# Patient Record
Sex: Female | Born: 1965
Health system: Southern US, Community
[De-identification: ages and names within clinical notes are randomized; demographics above are authoritative.]

## PROBLEM LIST (undated history)

## (undated) DIAGNOSIS — A6000 Herpesviral infection of urogenital system, unspecified: Secondary | ICD-10-CM

## (undated) DIAGNOSIS — K219 Gastro-esophageal reflux disease without esophagitis: Secondary | ICD-10-CM

## (undated) DIAGNOSIS — N920 Excessive and frequent menstruation with regular cycle: Secondary | ICD-10-CM

## (undated) DIAGNOSIS — N946 Dysmenorrhea, unspecified: Secondary | ICD-10-CM

## (undated) DIAGNOSIS — Z8619 Personal history of other infectious and parasitic diseases: Secondary | ICD-10-CM

## (undated) DIAGNOSIS — D649 Anemia, unspecified: Secondary | ICD-10-CM

## (undated) DIAGNOSIS — R002 Palpitations: Secondary | ICD-10-CM

## (undated) DIAGNOSIS — Z8742 Personal history of other diseases of the female genital tract: Secondary | ICD-10-CM

## (undated) DIAGNOSIS — Z8669 Personal history of other diseases of the nervous system and sense organs: Secondary | ICD-10-CM

## (undated) DIAGNOSIS — I10 Essential (primary) hypertension: Secondary | ICD-10-CM

## (undated) DIAGNOSIS — E559 Vitamin D deficiency, unspecified: Secondary | ICD-10-CM

## (undated) DIAGNOSIS — N200 Calculus of kidney: Secondary | ICD-10-CM

## (undated) HISTORY — DX: Anemia, unspecified: D64.9

## (undated) HISTORY — DX: Essential (primary) hypertension: I10

## (undated) HISTORY — DX: Calculus of kidney: N20.0

## (undated) HISTORY — DX: Personal history of other infectious and parasitic diseases: Z86.19

## (undated) HISTORY — PX: APPENDECTOMY: SHX54

## (undated) HISTORY — PX: TONSILLECTOMY AND ADENOIDECTOMY: SUR1326

## (undated) HISTORY — DX: Personal history of other diseases of the female genital tract: Z87.42

## (undated) HISTORY — PX: KNEE ARTHROPLASTY: SHX992

## (undated) HISTORY — DX: Palpitations: R00.2

## (undated) HISTORY — DX: Gastro-esophageal reflux disease without esophagitis: K21.9

## (undated) HISTORY — DX: Vitamin D deficiency, unspecified: E55.9

## (undated) HISTORY — DX: Personal history of other diseases of the nervous system and sense organs: Z86.69

## (undated) HISTORY — DX: Excessive and frequent menstruation with regular cycle: N92.0

## (undated) HISTORY — DX: Herpesviral infection of urogenital system, unspecified: A60.00

## (undated) HISTORY — DX: Dysmenorrhea, unspecified: N94.6

## (undated) HISTORY — PX: COSMETIC SURGERY: SHX468

---

## 1991-01-09 DIAGNOSIS — Z8619 Personal history of other infectious and parasitic diseases: Secondary | ICD-10-CM

## 1991-01-09 HISTORY — DX: Personal history of other infectious and parasitic diseases: Z86.19

## 1997-09-27 ENCOUNTER — Ambulatory Visit (HOSPITAL_COMMUNITY): Admission: RE | Admit: 1997-09-27 | Discharge: 1997-09-27 | Payer: Self-pay | Admitting: Obstetrics and Gynecology

## 1997-11-02 ENCOUNTER — Ambulatory Visit (HOSPITAL_COMMUNITY): Admission: RE | Admit: 1997-11-02 | Discharge: 1997-11-02 | Payer: Self-pay | Admitting: Obstetrics and Gynecology

## 1998-01-10 ENCOUNTER — Ambulatory Visit (HOSPITAL_COMMUNITY): Admission: RE | Admit: 1998-01-10 | Discharge: 1998-01-10 | Payer: Self-pay | Admitting: Obstetrics and Gynecology

## 1998-01-17 ENCOUNTER — Encounter: Payer: Self-pay | Admitting: Obstetrics and Gynecology

## 1998-01-17 ENCOUNTER — Ambulatory Visit (HOSPITAL_COMMUNITY): Admission: RE | Admit: 1998-01-17 | Discharge: 1998-01-17 | Payer: Self-pay | Admitting: Obstetrics and Gynecology

## 1998-04-05 ENCOUNTER — Ambulatory Visit (HOSPITAL_COMMUNITY): Admission: RE | Admit: 1998-04-05 | Discharge: 1998-04-05 | Payer: Self-pay

## 2007-03-11 ENCOUNTER — Ambulatory Visit (HOSPITAL_COMMUNITY): Admission: RE | Admit: 2007-03-11 | Discharge: 2007-03-11 | Payer: Self-pay | Admitting: Cardiology

## 2007-03-11 ENCOUNTER — Encounter (INDEPENDENT_AMBULATORY_CARE_PROVIDER_SITE_OTHER): Payer: Self-pay | Admitting: Cardiology

## 2007-03-20 ENCOUNTER — Ambulatory Visit (HOSPITAL_COMMUNITY): Admission: RE | Admit: 2007-03-20 | Discharge: 2007-03-20 | Payer: Self-pay | Admitting: Gastroenterology

## 2007-08-13 ENCOUNTER — Other Ambulatory Visit: Admission: RE | Admit: 2007-08-13 | Discharge: 2007-08-13 | Payer: Self-pay | Admitting: Obstetrics and Gynecology

## 2007-08-13 ENCOUNTER — Encounter: Admission: RE | Admit: 2007-08-13 | Discharge: 2007-08-13 | Payer: Self-pay | Admitting: Cardiology

## 2007-12-12 ENCOUNTER — Ambulatory Visit (HOSPITAL_COMMUNITY): Admission: RE | Admit: 2007-12-12 | Discharge: 2007-12-12 | Payer: Self-pay | Admitting: Orthopedic Surgery

## 2008-02-13 ENCOUNTER — Ambulatory Visit (HOSPITAL_COMMUNITY): Admission: RE | Admit: 2008-02-13 | Discharge: 2008-02-13 | Payer: Self-pay | Admitting: Family Medicine

## 2008-02-24 ENCOUNTER — Ambulatory Visit: Payer: Self-pay | Admitting: Nurse Practitioner

## 2008-03-09 ENCOUNTER — Ambulatory Visit: Payer: Self-pay | Admitting: Nurse Practitioner

## 2008-08-26 ENCOUNTER — Ambulatory Visit (HOSPITAL_COMMUNITY): Admission: RE | Admit: 2008-08-26 | Discharge: 2008-08-26 | Payer: Self-pay | Admitting: Obstetrics and Gynecology

## 2008-12-05 ENCOUNTER — Emergency Department (HOSPITAL_COMMUNITY): Admission: EM | Admit: 2008-12-05 | Discharge: 2008-12-05 | Payer: Self-pay | Admitting: Emergency Medicine

## 2009-01-17 ENCOUNTER — Ambulatory Visit (HOSPITAL_COMMUNITY): Admission: RE | Admit: 2009-01-17 | Discharge: 2009-01-17 | Payer: Self-pay | Admitting: Cardiology

## 2009-06-07 ENCOUNTER — Emergency Department (HOSPITAL_COMMUNITY): Admission: EM | Admit: 2009-06-07 | Discharge: 2009-06-07 | Payer: Self-pay | Admitting: Emergency Medicine

## 2009-11-29 ENCOUNTER — Ambulatory Visit (HOSPITAL_COMMUNITY): Admission: RE | Admit: 2009-11-29 | Discharge: 2009-11-29 | Payer: Self-pay | Admitting: Orthopedic Surgery

## 2010-01-29 ENCOUNTER — Encounter: Payer: Self-pay | Admitting: Gastroenterology

## 2010-04-05 ENCOUNTER — Ambulatory Visit: Payer: PRIVATE HEALTH INSURANCE | Attending: Occupational Medicine | Admitting: Occupational Therapy

## 2010-04-05 DIAGNOSIS — M25549 Pain in joints of unspecified hand: Secondary | ICD-10-CM | POA: Insufficient documentation

## 2010-04-05 DIAGNOSIS — M6281 Muscle weakness (generalized): Secondary | ICD-10-CM | POA: Insufficient documentation

## 2010-04-05 DIAGNOSIS — IMO0001 Reserved for inherently not codable concepts without codable children: Secondary | ICD-10-CM | POA: Insufficient documentation

## 2010-04-05 DIAGNOSIS — M25639 Stiffness of unspecified wrist, not elsewhere classified: Secondary | ICD-10-CM | POA: Insufficient documentation

## 2010-04-12 ENCOUNTER — Ambulatory Visit: Payer: PRIVATE HEALTH INSURANCE | Attending: Occupational Medicine | Admitting: Occupational Therapy

## 2010-04-12 DIAGNOSIS — M25549 Pain in joints of unspecified hand: Secondary | ICD-10-CM | POA: Insufficient documentation

## 2010-04-12 DIAGNOSIS — IMO0001 Reserved for inherently not codable concepts without codable children: Secondary | ICD-10-CM | POA: Insufficient documentation

## 2010-04-12 DIAGNOSIS — M6281 Muscle weakness (generalized): Secondary | ICD-10-CM | POA: Insufficient documentation

## 2010-04-12 LAB — POCT CARDIAC MARKERS
CKMB, poc: <1 ng/mL — ABNORMAL LOW (ref 1.0–8.0)
Myoglobin, poc: 57.7 ng/mL (ref 12–200)
Troponin i, poc: <0.05 ng/mL (ref 0.00–0.09)

## 2010-04-12 LAB — BASIC METABOLIC PANEL
CO2: 28 mEq/L (ref 19–32)
Chloride: 102 mEq/L (ref 96–112)
Creatinine, Ser: 0.75 mg/dL (ref 0.4–1.2)
GFR calc Af Amer: >60 mL/min (ref >60)

## 2010-04-12 LAB — CBC
Hemoglobin: 11.5 g/dL — ABNORMAL LOW (ref 12.0–15.0)
MCHC: 33.2 g/dL (ref 30.0–36.0)
MCV: 83.1 fL (ref 78.0–100.0)
RBC: 4.17 MIL/uL (ref 3.87–5.11)

## 2010-04-17 ENCOUNTER — Ambulatory Visit: Payer: PRIVATE HEALTH INSURANCE | Admitting: Occupational Therapy

## 2010-04-20 ENCOUNTER — Encounter: Payer: Self-pay | Admitting: Occupational Therapy

## 2010-04-24 ENCOUNTER — Encounter: Payer: Self-pay | Admitting: Occupational Therapy

## 2010-04-27 ENCOUNTER — Encounter: Payer: Self-pay | Admitting: Occupational Therapy

## 2010-05-03 ENCOUNTER — Ambulatory Visit: Payer: PRIVATE HEALTH INSURANCE | Admitting: Occupational Therapy

## 2010-05-23 NOTE — Assessment & Plan Note (Signed)
Sherry Wallace, Sherry Wallace NO.:  [id number]   MEDICAL RECORD NO.:  [id number]          PATIENT TYPE:  POB   LOCATION:  [facility] at [facility]         FACILITY:  [facility]   PHYSICIAN:  Tinnie Gens, MD        DATE OF BIRTH:  02-16-1965   DATE OF SERVICE:                                  CLINIC NOTE   The patient comes to office today for followup on her migraine  headaches.  The patient was seen approximately 2 weeks ago.  Please see  that note.  She was at that time in status we were attempting to break  that headache cycle.  She did have her menstrual cycle since she was  seen last.  She did notice the fluctuation in her headaches.  She took  one whole Imitrex with some Aleve and felt that she was very knocked  out by this.  She did state that the headache seemed to be relieved  completely, but she felt the medicine was too strong for her.  We were  able to get her FMLA filled out since her last office visit.  The  patient is happy with the headache being broken and is happy to use the  Imitrex half a tablet on an as-needed basis.   ASSESSMENT:  Migraine headache.   PLAN:  The patient will continue using the Imitrex, but break the dose  in half.  She can certainly add anti-inflammatories as needed as long as  her headaches works well with this therapy.  She does not need to return  other than on an as-needed basis.  If her headaches are not managed with  this, she is encouraged to call back or return to this office.       Remonia Richter, NP    ______________________________  Tinnie Gens, MD    LR/MEDQ  D:  03/09/2008  T:  03/10/2008  Job:  [id number]

## 2010-05-23 NOTE — Assessment & Plan Note (Signed)
NAMEAMBERROSE, FRIEBEL NO.:  [id number]   MEDICAL RECORD NO.:  [id number]          PATIENT TYPE:  POB   LOCATION:  [facility]         FACILITY:  [facility]   PHYSICIAN:  Argentina Donovan, MD        DATE OF BIRTH:  01-May-1965   DATE OF SERVICE:  02/24/2008                                  CLINIC NOTE   The patient comes to office today for consultation on her migraine  headaches.  The patient has had migraine with aura for the past 20  years.  She has actually had her last headache during 1994 after she  came off of her birth control pills.  Since she has come off of her  birth control pills, she has had her children and not had any headaches.  She did notice that this past summer and fall that she was starting to  have more headaches, though nothing too alarming.  In this past January,  she started having several severe headaches and then has ended with  having a long-term headache that has lasted until several days ago.  She  did see her primary care physician who was physician at [facility].  This physician ordered an MRI on February 13, 2008, which was  negative.  She gave her a Medrol Dosepak for 12 days and a Vicodin as  well as Phenergan.  The patient has not had a headache for the last 3  days only.  We did review her stressors in her life.  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  She is married.  Her husband is disabled with mental  illness and she does have 2 relatively small children.  She does feel  that her life is stressful, though she does not feel overly stressed.  She is also having some change in her menstrual cycle and feels that she  is in perimenopausal state.  She does have a history of kidney stones.  She denies any history of heart disease.  She is currently taking  atenolol 25 mg for some occasional tachycardia.   PHYSICAL EXAMINATION:  GENERAL:  A well-developed,  well-nourished 45-  year-old Caucasian female in no acute distress.  VITAL SIGNS:  Blood pressure 113/76, pulse is 77, weight 164, height is  5 feet 4.  HEENT:  Head is normocephalic and atraumatic.  CARDIAC:  Regular rate and rhythm.  LUNGS:  Clear bilaterally.  NEUROLOGIC:  The patient is alert and oriented.  She appears calm and  not anxious.  She is coordinated.  She has good muscle sensation.   ALLERGIES:  PENICILLIN, SULFA.   CURRENT MEDICATIONS:  1. Protonix 40 mg.  2. Atenolol 25 mg.   MENSTRUAL HISTORY:  First day of last period was January 26, 2008.   OBSTETRICAL HISTORY:  She is G3, P2.  Last Pap smear was August 2009.  Last mammogram was January 2008.   SURGICAL HISTORY:  C-sections x2, diagnostic lap, appendectomy,  rhinoplasty, T&A, left knee chondroplasty, right knee chondroplasty.   FAMILY HISTORY:  Heart disease, cancer.   PERSONAL  MEDICAL HISTORY:  She does have a history of kidney stones and  migraines.   SOCIAL HISTORY:  She lives at home with her husband and 2 children.  She  does work again in [employer].  She does not smoke.  She has a rare  alcohol.  She does not drink caffeinated beverages.   SYSTEMS REVIEWED:  Negative bruising, numbness, swelling, muscle aches,  fevers, problems with vision, hearing, nausea, vomiting, vaginal odor,  pain with intercourse, positive for frequent headaches.   ASSESSMENT AND PLAN:  Assessment is migraine status.  She does feel that  her headaches are relieving.  We did have a rather lengthy discussion  approximately 45 minutes today, which we discussed all of the possible  options.  After this lengthy discussion, we have decided that we will  try using Imitrex and see if her headache cycle remains broken.  She is  on the atenolol, which is prevention.  We have also discussed her  trialing Topamax and/or Effexor.  At this point, though we will just use  the Imitrex 7 mg 1 p.o. p.r.n. migraine #9 with 3 refills.   The patient  will return to the office in 2-3 weeks for followup.  She is encouraged  to use the medication that she has already purchased and call the office  if she has any questions.      Remonia Richter, NP    ______________________________  Argentina Donovan, MD    LR/MEDQ  D:  02/24/2008  T:  02/25/2008  Job:  [id number]

## 2010-08-28 ENCOUNTER — Other Ambulatory Visit: Payer: Self-pay | Admitting: Obstetrics and Gynecology

## 2010-08-28 DIAGNOSIS — Z1231 Encounter for screening mammogram for malignant neoplasm of breast: Secondary | ICD-10-CM

## 2010-09-01 ENCOUNTER — Ambulatory Visit (HOSPITAL_COMMUNITY)
Admission: RE | Admit: 2010-09-01 | Discharge: 2010-09-01 | Disposition: A | Discharge status: Home / Self Care | Payer: 59 | Source: Ambulatory Visit | Attending: Obstetrics and Gynecology | Admitting: Obstetrics and Gynecology

## 2010-09-01 DIAGNOSIS — Z1231 Encounter for screening mammogram for malignant neoplasm of breast: Secondary | ICD-10-CM | POA: Insufficient documentation

## 2010-10-04 ENCOUNTER — Inpatient Hospital Stay (INDEPENDENT_AMBULATORY_CARE_PROVIDER_SITE_OTHER)
Admission: RE | Admit: 2010-10-04 | Discharge: 2010-10-04 | Disposition: A | Discharge status: Home / Self Care | Payer: PRIVATE HEALTH INSURANCE | Source: Ambulatory Visit | Attending: Emergency Medicine | Admitting: Emergency Medicine

## 2010-10-04 DIAGNOSIS — J069 Acute upper respiratory infection, unspecified: Secondary | ICD-10-CM

## 2011-11-07 ENCOUNTER — Ambulatory Visit (INDEPENDENT_AMBULATORY_CARE_PROVIDER_SITE_OTHER): Payer: 59 | Admitting: Internal Medicine

## 2011-11-07 ENCOUNTER — Encounter: Payer: Self-pay | Admitting: Internal Medicine

## 2011-11-07 VITALS — BP 120/80 | HR 70 | Temp 98.1°F | Resp 18 | Ht 64.0 in | Wt 164.0 lb

## 2011-11-07 DIAGNOSIS — R002 Palpitations: Secondary | ICD-10-CM

## 2011-11-07 DIAGNOSIS — N920 Excessive and frequent menstruation with regular cycle: Secondary | ICD-10-CM | POA: Insufficient documentation

## 2011-11-07 DIAGNOSIS — K219 Gastro-esophageal reflux disease without esophagitis: Secondary | ICD-10-CM | POA: Insufficient documentation

## 2011-11-07 DIAGNOSIS — E559 Vitamin D deficiency, unspecified: Secondary | ICD-10-CM

## 2011-11-07 DIAGNOSIS — A6000 Herpesviral infection of urogenital system, unspecified: Secondary | ICD-10-CM

## 2011-11-07 DIAGNOSIS — Z8742 Personal history of other diseases of the female genital tract: Secondary | ICD-10-CM

## 2011-11-07 DIAGNOSIS — Z8669 Personal history of other diseases of the nervous system and sense organs: Secondary | ICD-10-CM

## 2011-11-07 DIAGNOSIS — Z862 Personal history of diseases of the blood and blood-forming organs and certain disorders involving the immune mechanism: Secondary | ICD-10-CM

## 2011-11-07 DIAGNOSIS — N946 Dysmenorrhea, unspecified: Secondary | ICD-10-CM | POA: Insufficient documentation

## 2011-11-07 LAB — CBC WITH DIFFERENTIAL/PLATELET
Basophils Relative: 1 % (ref 0–1)
Eosinophils Absolute: 0.4 10*3/uL (ref 0.0–0.7)
Eosinophils Relative: 5 % (ref 0–5)
Lymphs Abs: 2.6 10*3/uL (ref 0.7–4.0)
MCH: 29.6 pg (ref 26.0–34.0)
MCHC: 34.6 g/dL (ref 30.0–36.0)
Monocytes Absolute: 0.6 10*3/uL (ref 0.1–1.0)
Monocytes Relative: 7 % (ref 3–12)
Neutrophils Relative %: 54 % (ref 43–77)
WBC: 8.1 10*3/uL (ref 4.0–10.5)

## 2011-11-07 LAB — COMPREHENSIVE METABOLIC PANEL
BUN: 17 mg/dL (ref 6–23)
CO2: 31 mEq/L (ref 19–32)
Glucose, Bld: 103 mg/dL — ABNORMAL HIGH (ref 70–99)
Sodium: 140 mEq/L (ref 135–145)
Total Bilirubin: 0.4 mg/dL (ref 0.3–1.2)
Total Protein: 7.7 g/dL (ref 6.0–8.3)

## 2011-11-07 LAB — LIPID PANEL
Cholesterol: 239 mg/dL — ABNORMAL HIGH (ref 0–200)
HDL: 63 mg/dL (ref >39)
Triglycerides: 80 mg/dL (ref <150)
VLDL: 16 mg/dL (ref 0–40)

## 2011-11-07 NOTE — Progress Notes (Signed)
Subjective:    Patient ID: Sherry Wallace, female    DOB: 05/09/1965, 46 y.o.   MRN: [id number]  HPI  New pt here for first visit.    Sherry Wallace works as an Engineer, petroleum at [employer].  PMH of anemia, GERD, heart palpitations controled with tenormin, endometriosis, migraine headaches, vitamin d deficiency and genital herpes  Last HSV outbreak approx 1 year ago.  She is taking daily vitamin D supplement and Iron replacement.    Pt notes she has had heavy menses with pain last few cycles.  Some clotting.  Reports anemia pre-dated heavy menses.    She states she did an ultrasound on herself and did not see fibroids but looked like she had adenomyosis.    Allergies  Allergen Reactions  . Penicillins Hives and Swelling  . Sulfa Antibiotics Nausea And Vomiting and Rash   Past Medical History  Diagnosis Date  . GERD (gastroesophageal reflux disease)   . Anemia    Past Surgical History  Procedure Date  . Cesarean section   . Appendectomy   . Cosmetic surgery   . Knee arthroplasty   . Tonsillectomy and adenoidectomy    History   Social History  . Marital Status: Married    Spouse Name: N/A    Number of Children: N/A  . Years of Education: N/A   Occupational History  . Not on file.   Social History Main Topics  . Smoking status: Never Smoker   . Smokeless tobacco: Not on file  . Alcohol Use: No  . Drug Use: No  . Sexually Active: Yes    Birth Control/ Protection: Surgical   Other Topics Concern  . Not on file   Social History Narrative  . No narrative on file   Family History  Problem Relation Age of Onset  . Cancer Maternal Grandmother   . Heart disease Maternal Grandfather   . Heart disease Paternal Grandmother   . Liver disease Mother    There is no problem list on file for this patient.  Current Outpatient Prescriptions on File Prior to Visit  Medication Sig Dispense Refill  . atenolol (TENORMIN) 25 MG tablet Take 25 mg by mouth daily.      .  ferrous gluconate (FERGON) 324 MG tablet Take 324 mg by mouth daily with breakfast.      . pantoprazole (PROTONIX) 40 MG tablet Take 40 mg by mouth daily.          Review of Systems See HPI    Objective:   Physical Exam  Physical Exam  Nursing note and vitals reviewed.  Constitutional: She is oriented to person, place, and time. She appears well-developed and well-nourished.  HENT:  Head: Normocephalic and atraumatic.  Cardiovascular: Normal rate and regular rhythm. Exam reveals no gallop and no friction rub.  No murmur heard.  Pulmonary/Chest: Breath sounds normal. She has no wheezes. She has no rales.  Neurological: She is alert and oriented to person, place, and time.  Skin: Skin is warm and dry.  Psychiatric: She has a normal mood and affect. Her behavior is normal.  EXt:  No edema            Assessment & Plan:  Menorrhagia  :  May be due to anovulatory cycles but with endometriosis  In the past and pain,  Will refer to GYN.  Pt state she would like to make her own appt with Dr. Penne Lash.  Check TSH,  Cbc chenistries today  Anemia  See above continue iron for now  gerd  History of migraines  History of genital herpes.  Cardiac palpitations controlled with tenormin  Vitamin D deficiency  Will check today

## 2011-11-07 NOTE — Patient Instructions (Addendum)
Go to my chart  Make appt with Dr. Penne Lash

## 2011-11-12 ENCOUNTER — Encounter: Payer: Self-pay | Admitting: *Deleted

## 2011-11-12 ENCOUNTER — Telehealth: Payer: Self-pay | Admitting: *Deleted

## 2011-11-12 NOTE — Telephone Encounter (Signed)
Left message awaiting return call lab results mailed to pt address

## 2011-11-12 NOTE — Telephone Encounter (Signed)
Message copied by Mathews Robinsons on Mon Nov 12, 2011  2:30 PM ------      Message from: Raechel Chute D      Created: Fri Nov 09, 2011  5:12 PM       Karen Kitchens            Call pt and let her know that her bad cholesterol is high.  She should see me in office.            Route back with appt date

## 2011-11-21 ENCOUNTER — Ambulatory Visit (INDEPENDENT_AMBULATORY_CARE_PROVIDER_SITE_OTHER): Payer: 59 | Admitting: Obstetrics & Gynecology

## 2011-11-21 ENCOUNTER — Encounter: Payer: Self-pay | Admitting: Obstetrics & Gynecology

## 2011-11-21 VITALS — BP 122/84 | HR 66 | Temp 96.9°F | Resp 16 | Ht 64.0 in | Wt 169.0 lb

## 2011-11-21 DIAGNOSIS — N920 Excessive and frequent menstruation with regular cycle: Secondary | ICD-10-CM

## 2011-11-21 NOTE — Progress Notes (Signed)
  Subjective:    Patient ID: Sherry Wallace, female    DOB: February 22, 1965, 46 y.o.   MRN: [id number]  HPI Pt presents for evaluation of menorrhagia.  Pt started having heavy menses in June.  They occur every 25-27 days.  She has molimina symptoms with some of her menses but not all.  She does not skip menses or have break through bleeding between cycles.  Pt uses vasectomy for birth control.  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  She has used her uterine scans as teaching material for students so she is very familiar with the contour of her uterus.  This past scan she believed her uterus looked like she had adenomyosis.  This past cycle she took ibuprofen 800 mg q 8 hours around the clock which helped with the bleeding and pain.   Review of Systems  Constitutional:       Hot flashes  Respiratory: Negative.   Cardiovascular: Positive for palpitations.  Genitourinary: Positive for vaginal bleeding and menstrual problem.  Neurological: Positive for light-headedness.       Objective:   Physical Exam  Vitals reviewed. Constitutional: She is oriented to person, place, and time. She appears well-developed and well-nourished. No distress.  HENT:  Head: Normocephalic and atraumatic.  Pulmonary/Chest: Effort normal.  Abdominal: Soft. She exhibits no distension. There is no tenderness. There is no rebound.  Genitourinary: No vaginal discharge found.       Vagina pale pink, uterus anteverted with no masses, no adnexal masses, but ovaries are not felt well, cervix closed.  Neurological: She is alert and oriented to person, place, and time.  Skin: Skin is warm and dry.  Psychiatric: She has a normal mood and affect.          Assessment & Plan:  46 year old G49P2012 with 5 months of heavy menses and pain with cycles  1-TV US 2-CBC is nml (pt on ferrous sulfate) 3-Day 3 FSH 4-Will consider the Mirena 5-Discussed Lysteda, OCPs, Depo, Ablation, Hysteretomy 6-Given that cycles are regular and no  break through bleeding, pt would like to forego endometrial biopsy.  If cycles change then will proceed.

## 2012-02-23 ENCOUNTER — Other Ambulatory Visit: Payer: Self-pay

## 2012-03-04 ENCOUNTER — Other Ambulatory Visit: Payer: Self-pay | Admitting: Obstetrics & Gynecology

## 2012-03-04 MED ORDER — IBUPROFEN 800 MG PO TABS: 800.0000 mg | ORAL_TABLET | Freq: Three times a day (TID) | ORAL | Status: DC | PRN

## 2012-03-13 ENCOUNTER — Encounter: Payer: Self-pay | Admitting: Cardiology

## 2012-03-27 ENCOUNTER — Other Ambulatory Visit: Payer: Self-pay | Admitting: Obstetrics & Gynecology

## 2012-03-27 ENCOUNTER — Other Ambulatory Visit: Payer: Self-pay | Admitting: *Deleted

## 2012-03-27 MED ORDER — MISOPROSTOL 200 MCG PO TABS: ORAL_TABLET | ORAL | Status: DC

## 2012-03-27 MED ORDER — ALPRAZOLAM 0.5 MG PO TABS: ORAL_TABLET | ORAL | Status: DC

## 2012-04-08 ENCOUNTER — Encounter: Payer: Self-pay | Admitting: Obstetrics & Gynecology

## 2012-04-08 ENCOUNTER — Ambulatory Visit: Payer: Self-pay | Admitting: Obstetrics & Gynecology

## 2012-04-08 ENCOUNTER — Ambulatory Visit (INDEPENDENT_AMBULATORY_CARE_PROVIDER_SITE_OTHER): Payer: 59 | Admitting: Obstetrics & Gynecology

## 2012-04-08 VITALS — BP 117/71 | HR 62 | Resp 16 | Ht 64.0 in | Wt 173.0 lb

## 2012-04-08 DIAGNOSIS — Z01812 Encounter for preprocedural laboratory examination: Secondary | ICD-10-CM

## 2012-04-08 DIAGNOSIS — N920 Excessive and frequent menstruation with regular cycle: Secondary | ICD-10-CM

## 2012-04-08 DIAGNOSIS — Z30430 Encounter for insertion of intrauterine contraceptive device: Secondary | ICD-10-CM

## 2012-04-08 LAB — POCT URINE PREGNANCY: Preg Test, Ur: NEGATIVE

## 2012-04-08 MED ORDER — LEVONORGESTREL 20 MCG/24HR IU IUD
INTRAUTERINE_SYSTEM | Freq: Once | INTRAUTERINE | Status: AC
  Administered 2012-04-08: 14:00:00 via INTRAUTERINE

## 2012-04-08 MED ORDER — LEVONORGESTREL 20 MCG/24HR IU IUD: 1.0000 | INTRAUTERINE_SYSTEM | Freq: Once | INTRAUTERINE | Status: DC

## 2012-04-08 NOTE — Progress Notes (Signed)
PT continues to have heavy menses. As discussed in the last note, endometrial biopsy is needed at this time.  Pt also would like a Mirnea to bridge her to Menopause.  ENDOMETRIAL BIOPSY     The indications for endometrial biopsy were reviewed.   Risks of the biopsy including cramping, bleeding, infection, uterine perforation, inadequate specimen and need for additional procedures  were discussed. The patient states she understands and agrees to undergo procedure today. Consent was signed. Time out was performed. Urine HCG was negative. A sterile speculum was placed in the patient's vagina and the cervix was prepped with Betadine. A single-toothed tenaculum was placed on the anterior lip of the cervix to stabilize it. The 3 mm pipelle was introduced into the endometrial cavity without difficulty to a depth of 9 cm, and a moderate amount of tissue was obtained and sent to pathology. The instruments were removed from the patient's vagina. Minimal bleeding from the cervix was noted. The patient tolerated the procedure well. Routine post-procedure instructions were given to the patient. The patient will follow up to review the results and for further management.    IUD Procedure Note Patient identified, informed consent performed.  Discussed risks of irregular bleeding, cramping, infection, malpositioning or misplacement of the IUD outside the uterus which may require further procedures. Time out was performed.  Urine pregnancy test negative.  Speculum placed in the vagina.  Cervix visualized.  Cleaned with Betadine x 2.  Grasped anteriorly with a single tooth tenaculum.  Uterus sounded to 9 cm.  Mirena IUD placed per manufacturer's recommendations.  Strings trimmed to 3 cm. Tenaculum was removed, good hemostasis noted.  Patient tolerated procedure well.   Patient was given post-procedure instructions and the Mirena care card with expiration date.  Patient was also asked to check IUD strings periodically and  follow up in 4-6 weeks for IUD check.

## 2012-08-26 ENCOUNTER — Ambulatory Visit (HOSPITAL_COMMUNITY)
Admission: RE | Admit: 2012-08-26 | Discharge: 2012-08-26 | Disposition: A | Discharge status: Home / Self Care | Payer: 59 | Source: Ambulatory Visit | Attending: Obstetrics & Gynecology | Admitting: Obstetrics & Gynecology

## 2012-08-26 ENCOUNTER — Other Ambulatory Visit: Payer: Self-pay | Admitting: Obstetrics & Gynecology

## 2012-08-26 DIAGNOSIS — Z1231 Encounter for screening mammogram for malignant neoplasm of breast: Secondary | ICD-10-CM | POA: Insufficient documentation

## 2012-10-10 ENCOUNTER — Telehealth: Payer: Self-pay | Admitting: *Deleted

## 2012-10-10 DIAGNOSIS — B009 Herpesviral infection, unspecified: Secondary | ICD-10-CM

## 2012-10-10 MED ORDER — IBUPROFEN 800 MG PO TABS: 800.0000 mg | ORAL_TABLET | Freq: Three times a day (TID) | ORAL | Status: DC | PRN

## 2012-10-10 MED ORDER — VALACYCLOVIR HCL 500 MG PO TABS: 500.0000 mg | ORAL_TABLET | Freq: Two times a day (BID) | ORAL | Status: DC

## 2012-10-10 NOTE — Telephone Encounter (Signed)
Pt called in adv has outbreak of herpes and needs meds called into pharm - sent meds as requested

## 2012-10-30 ENCOUNTER — Other Ambulatory Visit: Payer: Self-pay | Admitting: Obstetrics & Gynecology

## 2012-11-13 ENCOUNTER — Other Ambulatory Visit: Payer: Self-pay

## 2013-01-21 ENCOUNTER — Ambulatory Visit: Payer: Self-pay | Admitting: Obstetrics & Gynecology

## 2013-03-12 ENCOUNTER — Ambulatory Visit: Payer: Self-pay | Admitting: Cardiology

## 2013-03-21 ENCOUNTER — Encounter: Payer: Self-pay | Admitting: *Deleted

## 2013-03-21 ENCOUNTER — Encounter: Payer: Self-pay | Admitting: Cardiology

## 2013-03-25 ENCOUNTER — Encounter: Payer: Self-pay | Admitting: General Surgery

## 2013-03-25 DIAGNOSIS — I10 Essential (primary) hypertension: Secondary | ICD-10-CM | POA: Insufficient documentation

## 2013-03-26 ENCOUNTER — Other Ambulatory Visit: Payer: Self-pay | Admitting: Cardiology

## 2013-04-01 ENCOUNTER — Ambulatory Visit (INDEPENDENT_AMBULATORY_CARE_PROVIDER_SITE_OTHER): Payer: 59 | Admitting: Cardiology

## 2013-04-01 ENCOUNTER — Encounter: Payer: Self-pay | Admitting: Cardiology

## 2013-04-01 VITALS — BP 112/77 | HR 67 | Ht 64.0 in | Wt 175.0 lb

## 2013-04-01 DIAGNOSIS — I10 Essential (primary) hypertension: Secondary | ICD-10-CM

## 2013-04-01 DIAGNOSIS — R002 Palpitations: Secondary | ICD-10-CM

## 2013-04-01 DIAGNOSIS — R0789 Other chest pain: Secondary | ICD-10-CM | POA: Insufficient documentation

## 2013-04-01 NOTE — Progress Notes (Addendum)
[address], [place]  [address] Phone: [phone] Fax:  [phone]  Date:  04/01/2013   ID:  Sherry Wallace, DOB 1966/01/08, MRN [id number]  PCP:  Cari Caraway, MD  Cardiologist:  Fransico Him, MD     History of Present Illness: Sherry Wallace is a 48 y.o. female with a history of palpitations related to caffeine intake and HTN.  She is doing well.  She denies any SOB, DOE, LE edema, dizziness or syncope. She has had a few episodes of palpitations over the past year.  Once episode occurred after caffeine and alcohol and noticed it while in bed and only lasted a few minutes and resolved.  She then in the fall got bad news about her Dad and had some palpitations but then settled out.  She occasionally will have some pressure in her chest after drinking caffeine and usually occurs at night when she is working and has been drinking more caffeine than usual.  She also will feel it after she walks a long distance.   Wt Readings from Last 3 Encounters:  04/08/12 173 lb (78.472 kg)  11/21/11 169 lb (76.658 kg)  11/07/11 164 lb (74.39 kg)     Past Medical History  Diagnosis Date  . GERD (gastroesophageal reflux disease)   . Anemia   . Dysmenorrhea   . Genital herpes   . History of endometriosis   . History of migraine headaches   . Menorrhagia   . Palpitations   . Vitamin D deficiency   . Esophageal reflux   . Renal calculus   . H/O Clostridium difficile infection 1993    from clindamycin  . Essential hypertension, benign     Current Outpatient Prescriptions  Medication Sig Dispense Refill  . atenolol (TENORMIN) 25 MG tablet TAKE 1 TABLET BY MOUTH ONCE DAILY  90 tablet  3  . pantoprazole (PROTONIX) 40 MG tablet Take 40 mg by mouth daily.      . valACYclovir (VALTREX) 500 MG tablet TAKE 1 TABLET BY MOUTH TWICE DAILY as needed       No current facility-administered medications for this visit.    Allergies:    Allergies  Allergen Reactions  . Penicillins  Hives and Swelling  . Sulfa Antibiotics Nausea And Vomiting and Rash    Social History:  The patient  reports that she has never smoked. She has never used smokeless tobacco. She reports that she uses illicit drugs. She reports that she does not drink alcohol.   Family History:  The patient's family history includes Cancer in her maternal grandmother; Colon polyps in her mother; Heart disease in her maternal grandfather and paternal grandmother; Liver disease in her mother.   ROS:  Please see the history of present illness.      All other systems reviewed and negative.   PHYSICAL EXAM: VS:  There were no vitals taken for this visit. Well nourished, well developed, in no acute distress HEENT: normal Neck: no JVD Cardiac:  normal S1, S2; RRR; no murmur Lungs:  clear to auscultation bilaterally, no wheezing, rhonchi or rales Abd: soft, nontender, no hepatomegaly Ext: no edema Skin: warm and dry Neuro:  CNs 2-12 intact, no focal abnormalities noted  EKG:     NSR with no ST changes  ASSESSMENT AND PLAN:  1. Palpitations well controlled on Atenolol 2. HTN well controlled - continue Atenolol 3.  Atypical Chest Pain - ETT to rule out ischemia  Folllowup with me in  1 year  Signed, Fransico Him, MD 04/01/2013 9:22 AM

## 2013-04-01 NOTE — Patient Instructions (Signed)
Your physician has requested that you have an exercise tolerance test. For further information please visit [email or web address]. Please also follow instruction sheet, as given.  Your physician recommends that you continue on your current medications as directed. Please refer to the Current Medication list given to you today.  Your physician wants you to follow-up in: 1 year with Dr. Radford Pax.   You will receive a reminder letter in the mail two months in advance. If you don't receive a letter, please call our office to schedule the follow-up appointment.

## 2013-04-16 ENCOUNTER — Telehealth (HOSPITAL_COMMUNITY): Payer: Self-pay

## 2013-04-17 ENCOUNTER — Telehealth (HOSPITAL_COMMUNITY): Payer: Self-pay

## 2013-04-22 ENCOUNTER — Ambulatory Visit (HOSPITAL_COMMUNITY)
Admission: RE | Admit: 2013-04-22 | Discharge: 2013-04-22 | Disposition: A | Discharge status: Home / Self Care | Payer: 59 | Source: Ambulatory Visit | Attending: Cardiology | Admitting: Cardiology

## 2013-04-22 DIAGNOSIS — R0789 Other chest pain: Secondary | ICD-10-CM

## 2013-04-22 DIAGNOSIS — R079 Chest pain, unspecified: Secondary | ICD-10-CM | POA: Insufficient documentation

## 2013-05-19 ENCOUNTER — Other Ambulatory Visit: Payer: Self-pay | Admitting: *Deleted

## 2013-05-19 DIAGNOSIS — B009 Herpesviral infection, unspecified: Secondary | ICD-10-CM

## 2013-05-19 MED ORDER — VALACYCLOVIR HCL 500 MG PO TABS: 500.0000 mg | ORAL_TABLET | Freq: Two times a day (BID) | ORAL | Status: DC

## 2013-05-19 NOTE — Telephone Encounter (Signed)
Rf request for 90 days supply on Valtrex sent to [facility].

## 2013-05-20 ENCOUNTER — Other Ambulatory Visit: Payer: Self-pay | Admitting: Dermatology

## 2013-07-08 NOTE — Telephone Encounter (Signed)
Encounter complete. 

## 2013-07-16 NOTE — Telephone Encounter (Signed)
Encounter complete. 

## 2013-08-16 ENCOUNTER — Other Ambulatory Visit: Payer: Self-pay | Admitting: Obstetrics & Gynecology

## 2013-08-16 MED ORDER — DIAZEPAM 10 MG PO TABS: ORAL_TABLET | ORAL | Status: DC

## 2013-08-16 NOTE — Progress Notes (Signed)
Pt undergoing Mohs surgery under local anesthesia.  Pt needs some sedation for the multi hour procedure.  Valium x2 tablets prescribed.

## 2013-11-09 ENCOUNTER — Encounter: Payer: Self-pay | Admitting: Cardiology

## 2013-11-18 ENCOUNTER — Other Ambulatory Visit: Payer: Self-pay | Admitting: Obstetrics & Gynecology

## 2013-11-23 ENCOUNTER — Other Ambulatory Visit: Payer: Self-pay | Admitting: *Deleted

## 2013-11-23 DIAGNOSIS — Z8669 Personal history of other diseases of the nervous system and sense organs: Secondary | ICD-10-CM

## 2013-11-23 MED ORDER — IBUPROFEN 800 MG PO TABS: 800.0000 mg | ORAL_TABLET | Freq: Three times a day (TID) | ORAL | Status: DC | PRN

## 2013-11-23 NOTE — Telephone Encounter (Signed)
RF request from [facility] for Ibuprofen 800 mg OK's per VO Monna Fam, NP

## 2014-01-18 ENCOUNTER — Encounter: Payer: Self-pay | Admitting: Cardiology

## 2014-03-24 NOTE — Progress Notes (Signed)
Cardiology Office Note   Date:  03/25/2014   ID:  Sherry Wallace, DOB 20-Aug-1965, MRN [id number]  PCP:  Cari Caraway, MD  Cardiologist:   Sueanne Margarita, MD   Chief Complaint  Patient presents with  . Palpitations  . Hypertension      History of Present Illness: Sherry Wallace is a 49 y.o. female with a history of palpitations related to caffeine intake and HTN. She is doing well. She denies any chest pain, SOB, DOE, LE edema, dizziness or syncope. She has had a few episodes of palpitations over the past year.  She has gained about 9 lbs in the past year.     Past Medical History  Diagnosis Date  . GERD (gastroesophageal reflux disease)   . Anemia   . Dysmenorrhea   . Genital herpes   . History of endometriosis   . History of migraine headaches   . Menorrhagia   . Palpitations   . Vitamin D deficiency   . Esophageal reflux   . Renal calculus   . H/O Clostridium difficile infection 1993    from clindamycin  . Essential hypertension, benign     Past Surgical History  Procedure Laterality Date  . Cesarean section      X2  . Appendectomy    . Cosmetic surgery      rhinoplasty  . Knee arthroplasty    . Tonsillectomy and adenoidectomy       Current Outpatient Prescriptions  Medication Sig Dispense Refill  . atenolol (TENORMIN) 25 MG tablet TAKE 1 TABLET BY MOUTH ONCE DAILY 90 tablet 3  . ibuprofen (ADVIL,MOTRIN) 800 MG tablet Take 1 tablet (800 mg total) by mouth every 8 (eight) hours as needed. 60 tablet 1  . pantoprazole (PROTONIX) 40 MG tablet Take 40 mg by mouth daily.    . valACYclovir (VALTREX) 500 MG tablet Take 1 tablet (500 mg total) by mouth 2 (two) times daily. (Patient taking differently: Take 500 mg by mouth as needed. ) 180 tablet 3   No current facility-administered medications for this visit.    Allergies:   Fentanyl; Penicillins; and Sulfa antibiotics    Social History:  The patient  reports that she has never smoked. She has never used  smokeless tobacco. She reports that she uses illicit drugs. She reports that she does not drink alcohol.   Family History:  The patient's family history includes Cancer in her maternal grandmother; Colon polyps in her mother; Heart disease in her maternal grandfather and paternal grandmother; Liver disease in her mother.    ROS:  Please see the history of present illness.   Otherwise, review of systems are positive for none.   All other systems are reviewed and negative.    PHYSICAL EXAM: VS:  BP 124/80 mmHg  Pulse 78  Ht 5\' 4"  (1.626 m)  Wt 184 lb 1.9 oz (83.516 kg)  BMI 31.59 kg/m2 , BMI Body mass index is 31.59 kg/(m^2). GEN: Well nourished, well developed, in no acute distress HEENT: normal Neck: no JVD, carotid bruits, or masses Cardiac: RRR; no murmurs, rubs, or gallops,no edema  Respiratory:  clear to auscultation bilaterally, normal work of breathing GI: soft, nontender, nondistended, + BS MS: no deformity or atrophy Skin: warm and dry, no rash Neuro:  Strength and sensation are intact Psych: euthymic mood, full affect   EKG:  EKG was ordered today and showed NSR with no ST changes    Recent Labs: No results found for requested  labs within last 365 days.    Lipid Panel    Component Value Date/Time   CHOL 239* 11/07/2011 1511   TRIG 80 11/07/2011 1511   HDL 63 11/07/2011 1511   CHOLHDL 3.8 11/07/2011 1511   VLDL 16 11/07/2011 1511   LDLCALC 160* 11/07/2011 1511      Wt Readings from Last 3 Encounters:  03/25/14 184 lb 1.9 oz (83.516 kg)  04/01/13 175 lb (79.379 kg)  04/08/12 173 lb (78.472 kg)    ASSESSMENT AND PLAN:  1. Palpitations well controlled on Atenolol 2. HTN well controlled.  Continue Atenolol - continue Atenolol   3.      Atypical Chest Pain - resolved - ETT with no ischemia 04/2013    Current medicines are reviewed at length with the patient today.  The patient does not have concerns regarding medicines.  The following changes have  been made:  no change  Labs/ tests ordered today include: None   Orders Placed This Encounter  Procedures  . EKG 12-Lead     Disposition:   FU with m3 in 1 year   Signed, Sueanne Margarita, MD  03/25/2014 2:25 PM    [facility] [address], [place], [place]  [address] Phone: [phone]; Fax: [phone]

## 2014-03-25 ENCOUNTER — Encounter: Payer: Self-pay | Admitting: Cardiology

## 2014-03-25 ENCOUNTER — Ambulatory Visit (INDEPENDENT_AMBULATORY_CARE_PROVIDER_SITE_OTHER): Payer: 59 | Admitting: Cardiology

## 2014-03-25 VITALS — BP 124/80 | HR 78 | Ht 64.0 in | Wt 184.1 lb

## 2014-03-25 DIAGNOSIS — R0789 Other chest pain: Secondary | ICD-10-CM

## 2014-03-25 DIAGNOSIS — R002 Palpitations: Secondary | ICD-10-CM

## 2014-03-25 DIAGNOSIS — I10 Essential (primary) hypertension: Secondary | ICD-10-CM

## 2014-03-25 NOTE — Patient Instructions (Addendum)
Your physician recommends that you continue on your current medications as directed. Please refer to the Current Medication list given to you today.  Your physician wants you to follow-up in: 1 year with Dr. Turner. You will receive a reminder letter in the mail two months in advance. If you don't receive a letter, please call our office to schedule the follow-up appointment.  

## 2014-04-12 ENCOUNTER — Other Ambulatory Visit: Payer: Self-pay | Admitting: Cardiology

## 2014-04-22 ENCOUNTER — Other Ambulatory Visit: Payer: Self-pay | Admitting: Dermatology

## 2014-04-29 ENCOUNTER — Encounter: Payer: Self-pay | Admitting: Cardiology

## 2014-04-29 ENCOUNTER — Ambulatory Visit (INDEPENDENT_AMBULATORY_CARE_PROVIDER_SITE_OTHER): Payer: 59 | Admitting: Cardiology

## 2014-04-29 VITALS — BP 120/76 | HR 75 | Ht 64.0 in | Wt 186.2 lb

## 2014-04-29 DIAGNOSIS — I10 Essential (primary) hypertension: Secondary | ICD-10-CM

## 2014-04-29 DIAGNOSIS — R002 Palpitations: Secondary | ICD-10-CM | POA: Diagnosis not present

## 2014-04-29 DIAGNOSIS — R0789 Other chest pain: Secondary | ICD-10-CM

## 2014-04-29 NOTE — Patient Instructions (Signed)
Medication Instructions:  None  Labwork: None  Testing/Procedures: Your physician has requested that you have en exercise stress myoview. For further information please visit [email or web address]. Please follow instruction sheet, as given.   Follow-Up: Your physician wants you to follow-up in: 1 year with Dr. Radford Pax. You will receive a reminder letter in the mail two months in advance. If you don't receive a letter, please call our office to schedule the follow-up appointment.   Any Other Special Instructions Will Be Listed Below (If Applicable).

## 2014-04-29 NOTE — Progress Notes (Signed)
Cardiology Office Note   Date:  04/29/2014   ID:  [name], DOB April 08, 1965, MRN [id number]  PCP:  Cari Caraway, MD    Chief Complaint  Patient presents with  . Chest Pain  . Palpitations  . Follow-up    hypertension      History of Present Illness: Sherry Wallace is a 49 y.o. female with a history of palpitations related to caffeine intake and HTN. She is doing well but a few weeks ago she started having some chest pain that is primarily mid sternal with no radiation into her arms or neck.  It usually is nonexertional but yesterday it occurred while she was walking up a hill and resolved after she reached level ground.  She occasionally has some SOB which she attributes to weight gain.  She denies any nausea or diaphoresis.  . She denies any LE edema, dizziness, palpitations or syncope. She had an ETT last year for atypical CP that was normal.  This chest pain is different and is a tightness in her chest but very localized.  She thinks it is worse with some movements like knitting or holding a book up.   Past Medical History  Diagnosis Date  . GERD (gastroesophageal reflux disease)   . Anemia   . Dysmenorrhea   . Genital herpes   . History of endometriosis   . History of migraine headaches   . Menorrhagia   . Palpitations   . Vitamin D deficiency   . Esophageal reflux   . Renal calculus   . H/O Clostridium difficile infection 1993    from clindamycin  . Essential hypertension, benign     Past Surgical History  Procedure Laterality Date  . Cesarean section      X2  . Appendectomy    . Cosmetic surgery      rhinoplasty  . Knee arthroplasty    . Tonsillectomy and adenoidectomy       Current Outpatient Prescriptions  Medication Sig Dispense Refill  . atenolol (TENORMIN) 25 MG tablet TAKE 1 TABLET BY MOUTH ONCE DAILY 90 tablet 3  . ibuprofen (ADVIL,MOTRIN) 800 MG tablet Take 1 tablet (800 mg total) by mouth every 8 (eight) hours as needed. 60 tablet 1  .  pantoprazole (PROTONIX) 40 MG tablet Take 40 mg by mouth daily.    . valACYclovir (VALTREX) 500 MG tablet Take 1 tablet (500 mg total) by mouth 2 (two) times daily. (Patient taking differently: Take 500 mg by mouth as needed. ) 180 tablet 3   No current facility-administered medications for this visit.    Allergies:   Fentanyl; Penicillins; and Sulfa antibiotics    Social History:  The patient  reports that she has never smoked. She has never used smokeless tobacco. She reports that she uses illicit drugs. She reports that she does not drink alcohol.   Family History:  The patient's family history includes Cancer in her maternal grandmother; Colon polyps in her mother; Heart disease in her maternal grandfather and paternal grandmother; Liver disease in her mother.    ROS:  Please see the history of present illness.   Otherwise, review of systems are positive for none.   All other systems are reviewed and negative.    PHYSICAL EXAM: VS:  BP 120/76 mmHg  Pulse 75  Ht 5\' 4"  (1.626 m)  Wt 186 lb 3.2 oz (84.46 kg)  BMI 31.95 kg/m2  SpO2 96% , BMI Body mass index is 31.95 kg/(m^2). GEN: Well  nourished, well developed, in no acute distress HEENT: normal Neck: no JVD, carotid bruits, or masses Cardiac: RRR; no murmurs, rubs, or gallops,no edema  Respiratory:  clear to auscultation bilaterally, normal work of breathing GI: soft, nontender, nondistended, + BS MS: no deformity or atrophy Skin: warm and dry, no rash Neuro:  Strength and sensation are intact Psych: euthymic mood, full affect   EKG:  EKG was ordered today showing NSR with no ST changes.    Recent Labs: No results found for requested labs within last 365 days.    Lipid Panel    Component Value Date/Time   CHOL 239* 11/07/2011 1511   TRIG 80 11/07/2011 1511   HDL 63 11/07/2011 1511   CHOLHDL 3.8 11/07/2011 1511   VLDL 16 11/07/2011 1511   LDLCALC 160* 11/07/2011 1511      Wt Readings from Last 3 Encounters:    04/29/14 186 lb 3.2 oz (84.46 kg)  03/25/14 184 lb 1.9 oz (83.516 kg)  04/01/13 175 lb (79.379 kg)     ASSESSMENT AND PLAN:  1. Palpitations well controlled on Atenolol 2. HTN well controlled.  -Continue Atenolol  3. Atypical Chest Pain - pain has now reoccurred but is different from her pain a year ago and is a pressure.  I will get a nuclear stress test to evaluate    Current medicines are reviewed at length with the patient today.  The patient does not have concerns regarding medicines.  The following changes have been made:  no change  Labs/ tests ordered today include: see above assessment and plan No orders of the defined types were placed in this encounter.     Disposition:   FU with me in 1 year   Signed, Sueanne Margarita, MD  04/29/2014 3:32 PM    [facility] [place], [place], [place]  [address] Phone: [phone]; Fax: [phone]

## 2014-05-10 ENCOUNTER — Telehealth (HOSPITAL_COMMUNITY): Payer: Self-pay | Admitting: *Deleted

## 2014-05-10 NOTE — Telephone Encounter (Signed)
Left message on voicemail in reference to upcoming appointment scheduled on 05/11/14/ at 1130 with detailed instructions given per Myocardial Perfusion Study Information Sheet for the test. Phone number given for call back for any questions. Emberlie Gotcher, Ranae Palms

## 2014-05-11 ENCOUNTER — Ambulatory Visit (HOSPITAL_COMMUNITY): Payer: 59 | Attending: Internal Medicine

## 2014-05-11 DIAGNOSIS — R0789 Other chest pain: Secondary | ICD-10-CM | POA: Insufficient documentation

## 2014-05-11 DIAGNOSIS — I10 Essential (primary) hypertension: Secondary | ICD-10-CM | POA: Insufficient documentation

## 2014-05-11 DIAGNOSIS — R002 Palpitations: Secondary | ICD-10-CM | POA: Diagnosis not present

## 2014-05-11 MED ORDER — TECHNETIUM TC 99M SESTAMIBI GENERIC - CARDIOLITE
33.0000 | Freq: Once | INTRAVENOUS | Status: AC | PRN
  Administered 2014-05-11: 33 via INTRAVENOUS

## 2014-05-11 MED ORDER — TECHNETIUM TC 99M SESTAMIBI GENERIC - CARDIOLITE
11.0000 | Freq: Once | INTRAVENOUS | Status: AC | PRN
  Administered 2014-05-11: 11 via INTRAVENOUS

## 2014-05-12 NOTE — Progress Notes (Unsigned)
This encounter was created in error - please disregard.

## 2014-05-13 LAB — MYOCARDIAL PERFUSION IMAGING
CHL CUP MPHR: 170 {beats}/min
CHL CUP NUCLEAR SDS: 3
CHL CUP NUCLEAR SSS: 5
CHL CUP STRESS STAGE 1 DBP: 91 mmHg
CHL CUP STRESS STAGE 1 SPEED: 0 mph
CHL CUP STRESS STAGE 2 SBP: 113 mmHg
CHL CUP STRESS STAGE 2 SPEED: 0 mph
CHL CUP STRESS STAGE 3 GRADE: 0 %
CHL CUP STRESS STAGE 3 HR: 75 {beats}/min
CHL CUP STRESS STAGE 3 SPEED: 0 mph
CHL CUP STRESS STAGE 5 GRADE: 12 %
CHL CUP STRESS STAGE 5 SPEED: 2.5 mph
CHL CUP STRESS STAGE 6 GRADE: 14 %
CHL CUP STRESS STAGE 6 HR: 166 {beats}/min
CHL CUP STRESS STAGE 6 SPEED: 3.4 mph
CHL CUP STRESS STAGE 7 DBP: 89 mmHg
CHL CUP STRESS STAGE 8 GRADE: 0 %
CHL CUP STRESS STAGE 8 SPEED: 0 mph
CSEPEW: 10.1 METS
CSEPPMHR: 97 %
Exercise duration (min): 8 min
Exercise duration (sec): 0 s
LHR: 0.39
LV dias vol: 69 mL
LVSYSVOL: 20 mL
Nuc Stress EF: 71 %
Peak HR: 166 {beats}/min
Percent HR: 97 %
Rest HR: 81 {beats}/min
SRS: 2
Stage 1 Grade: 0 %
Stage 1 HR: 87 {beats}/min
Stage 1 SBP: 121 mmHg
Stage 2 DBP: 89 mmHg
Stage 2 Grade: 0 %
Stage 2 HR: 76 {beats}/min
Stage 4 Grade: 10 %
Stage 4 HR: 115 {beats}/min
Stage 4 Speed: 1.7 mph
Stage 5 DBP: 87 mmHg
Stage 5 HR: 134 {beats}/min
Stage 5 SBP: 124 mmHg
Stage 7 Grade: 0 %
Stage 7 HR: 150 {beats}/min
Stage 7 SBP: 128 mmHg
Stage 7 Speed: 0 mph
Stage 8 DBP: 85 mmHg
Stage 8 HR: 100 {beats}/min
Stage 8 SBP: 114 mmHg
TID: 0.84

## 2014-09-05 ENCOUNTER — Other Ambulatory Visit: Payer: Self-pay | Admitting: Obstetrics & Gynecology

## 2014-09-05 DIAGNOSIS — E079 Disorder of thyroid, unspecified: Secondary | ICD-10-CM

## 2014-09-09 ENCOUNTER — Ambulatory Visit (HOSPITAL_BASED_OUTPATIENT_CLINIC_OR_DEPARTMENT_OTHER): Payer: 59

## 2014-09-14 ENCOUNTER — Other Ambulatory Visit: Payer: Self-pay | Admitting: Obstetrics & Gynecology

## 2014-09-14 DIAGNOSIS — Z1231 Encounter for screening mammogram for malignant neoplasm of breast: Secondary | ICD-10-CM

## 2014-09-15 ENCOUNTER — Ambulatory Visit (HOSPITAL_COMMUNITY)
Admission: RE | Admit: 2014-09-15 | Discharge: 2014-09-15 | Disposition: A | Discharge status: Home / Self Care | Payer: 59 | Source: Ambulatory Visit | Attending: Obstetrics & Gynecology | Admitting: Obstetrics & Gynecology

## 2014-09-15 ENCOUNTER — Other Ambulatory Visit: Payer: Self-pay | Admitting: Obstetrics & Gynecology

## 2014-09-15 DIAGNOSIS — E041 Nontoxic single thyroid nodule: Secondary | ICD-10-CM | POA: Diagnosis not present

## 2014-09-15 DIAGNOSIS — Z1231 Encounter for screening mammogram for malignant neoplasm of breast: Secondary | ICD-10-CM

## 2014-09-15 DIAGNOSIS — R599 Enlarged lymph nodes, unspecified: Secondary | ICD-10-CM | POA: Diagnosis not present

## 2014-09-15 DIAGNOSIS — E079 Disorder of thyroid, unspecified: Secondary | ICD-10-CM

## 2014-10-10 ENCOUNTER — Encounter: Payer: Self-pay | Admitting: Obstetrics & Gynecology

## 2014-10-11 ENCOUNTER — Telehealth: Payer: Self-pay | Admitting: *Deleted

## 2014-10-11 NOTE — Telephone Encounter (Signed)
-----   Message from Guss Bunde, MD sent at 10/10/2014  7:04 AM EDT ----- TSH, Free T3, Free T4

## 2014-10-13 ENCOUNTER — Telehealth: Payer: Self-pay | Admitting: *Deleted

## 2014-10-13 DIAGNOSIS — R946 Abnormal results of thyroid function studies: Secondary | ICD-10-CM

## 2014-10-13 NOTE — Telephone Encounter (Signed)
-----   Message from Guss Bunde, MD sent at 10/10/2014  7:03 AM EDT ----- TSH, Free T3, Free T4

## 2014-10-13 NOTE — Telephone Encounter (Signed)
LM on both cell phone and home to call office regarding pt getting thyroid studies done per Dr Gala Romney.  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

## 2014-11-24 ENCOUNTER — Other Ambulatory Visit: Payer: 59

## 2014-11-24 ENCOUNTER — Other Ambulatory Visit: Payer: Self-pay | Admitting: Orthopaedic Surgery

## 2014-11-24 ENCOUNTER — Other Ambulatory Visit: Payer: Self-pay | Admitting: Obstetrics & Gynecology

## 2014-11-24 DIAGNOSIS — R946 Abnormal results of thyroid function studies: Secondary | ICD-10-CM

## 2014-11-24 DIAGNOSIS — R102 Pelvic and perineal pain: Secondary | ICD-10-CM

## 2014-11-24 DIAGNOSIS — M25511 Pain in right shoulder: Secondary | ICD-10-CM

## 2014-11-24 NOTE — Progress Notes (Signed)
Pt here for labs only and order placed for TVU to check IUD placement

## 2014-11-25 ENCOUNTER — Other Ambulatory Visit: Payer: Self-pay | Admitting: Obstetrics & Gynecology

## 2014-11-25 ENCOUNTER — Ambulatory Visit (HOSPITAL_COMMUNITY)
Admission: RE | Admit: 2014-11-25 | Discharge: 2014-11-25 | Disposition: A | Discharge status: Home / Self Care | Payer: 59 | Source: Ambulatory Visit | Attending: Obstetrics & Gynecology | Admitting: Obstetrics & Gynecology

## 2014-11-25 ENCOUNTER — Telehealth: Payer: Self-pay | Admitting: *Deleted

## 2014-11-25 DIAGNOSIS — Z30431 Encounter for routine checking of intrauterine contraceptive device: Secondary | ICD-10-CM | POA: Diagnosis not present

## 2014-11-25 DIAGNOSIS — R102 Pelvic and perineal pain: Secondary | ICD-10-CM

## 2014-11-25 LAB — TSH: TSH: 1.189 u[IU]/mL (ref 0.350–4.500)

## 2014-11-25 LAB — T4, FREE: Free T4: 0.96 ng/dL (ref 0.80–1.80)

## 2014-11-25 LAB — T3, FREE: T3 FREE: 2.6 pg/mL (ref 2.3–4.2)

## 2014-11-25 NOTE — Telephone Encounter (Signed)
Dr Gala Romney notified of U/S results.

## 2014-11-25 NOTE — Telephone Encounter (Signed)
Received a call from [facility] stating results from an ultrasound Dr. Gala Romney ordered and the radiology requested results be called. States Ultrasound shows IUD is difficult to visualize- a portion of device appears to be near or within the c/section scar. The presence of 1 or both arms of the IUD may be in the myometrium but it is difficult to confirm.  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 Gala Romney- she will follow up.

## 2014-11-25 NOTE — Telephone Encounter (Signed)
Pt notified of normal Thyroid studies and Dr Gala Romney notified of pelvic U/S report and will contact pt about the plan.

## 2014-11-26 ENCOUNTER — Telehealth: Payer: Self-pay | Admitting: *Deleted

## 2014-11-26 DIAGNOSIS — R102 Pelvic and perineal pain: Secondary | ICD-10-CM

## 2014-11-26 LAB — FOLLICLE STIMULATING HORMONE: FSH: 76.4 m[IU]/mL

## 2014-11-26 NOTE — Telephone Encounter (Signed)
Ordered FSH per dr leggett

## 2014-12-04 ENCOUNTER — Ambulatory Visit
Admission: RE | Admit: 2014-12-04 | Discharge: 2014-12-04 | Disposition: A | Discharge status: Home / Self Care | Payer: Self-pay | Source: Ambulatory Visit | Attending: Orthopaedic Surgery | Admitting: Orthopaedic Surgery

## 2014-12-04 DIAGNOSIS — M25511 Pain in right shoulder: Secondary | ICD-10-CM

## 2014-12-09 ENCOUNTER — Ambulatory Visit: Payer: PRIVATE HEALTH INSURANCE | Attending: Orthopaedic Surgery

## 2014-12-09 DIAGNOSIS — M25611 Stiffness of right shoulder, not elsewhere classified: Secondary | ICD-10-CM | POA: Diagnosis present

## 2014-12-09 DIAGNOSIS — M25511 Pain in right shoulder: Secondary | ICD-10-CM | POA: Diagnosis not present

## 2014-12-09 DIAGNOSIS — R6889 Other general symptoms and signs: Secondary | ICD-10-CM | POA: Insufficient documentation

## 2014-12-09 NOTE — Patient Instructions (Signed)
Tape management with removal of tape with irritation and keep on 3-4 days and can get wet. She is familiar with tape from previous injury.  Corner and hor adduction stretch 30 sec x 2-3 reps x 20-3 x/day, issued from cabuinet

## 2014-12-09 NOTE — Therapy (Signed)
Sherry Wallace, [place], [address] Phone: [phone]   Fax:  [phone]  Physical Therapy Evaluation  Patient Details  Name: Sherry Wallace MRN: [id number] Date of Birth: Jan 02, 1966 Referring Provider: Joni Fears, MD  Encounter Date: 12/09/2014      PT End of Session - 12/09/14 1028    Visit Number 1   Number of Visits 12   Date for PT Re-Evaluation 01/20/15   PT Start Time 0930   PT Stop Time 1015   PT Time Calculation (min) 45 min   Activity Tolerance Patient tolerated treatment well   Behavior During Therapy [facility] for tasks assessed/performed      Past Medical History  Diagnosis Date  . GERD (gastroesophageal reflux disease)   . Anemia   . Dysmenorrhea   . Genital herpes   . History of endometriosis   . History of migraine headaches   . Menorrhagia   . Palpitations   . Vitamin D deficiency   . Esophageal reflux   . Renal calculus   . H/O Clostridium difficile infection 1993    from clindamycin  . Essential hypertension, benign     Past Surgical History  Procedure Laterality Date  . Cesarean section      X2  . Appendectomy    . Cosmetic surgery      rhinoplasty  . Knee arthroplasty    . Tonsillectomy and adenoidectomy      There were no vitals filed for this visit.  Visit Diagnosis:  Pain in joint of right shoulder  Stiffness of shoulder joint, right  Activity intolerance      Subjective Assessment - 12/09/14 0941    Subjective She reports doing US exam on obese patient for 30 min and began to have pain in RT shoulder. She thinks she had to push harder than normal to do test. No previous shoulder issues. 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 reaching overhead , hugging spouse.    How long can you sit comfortably? AS needed    How long can you  stand comfortably? As needed   How long can you walk comfortably? As needed   Diagnostic tests Xrays and MRI. :OA in [facility] joint and tendonitis   Patient Stated Goals She would like to be able to do job painfree.    Currently in Pain? 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  No   Is the patient reluctant to leave their home because of a fear of falling?  No   Prior Function   Level of Independence Independent   Cognition   Overall Cognitive Status Within Functional Limits for tasks assessed   ROM / Strength   AROM / PROM / Strength AROM;PROM;Strength   AROM   AROM Assessment Site Shoulder   Right/Left Shoulder Right;Left   Right Shoulder Extension 53 Degrees   Right Shoulder Flexion 155 Degrees   Right Shoulder ABduction 145 Degrees   Right Shoulder Internal Rotation 50 Degrees   Right Shoulder External Rotation 90 Degrees   Right Shoulder Horizontal ABduction 24 Degrees   Right Shoulder Horizontal  ADduction 112 Degrees   Left Shoulder Extension 55 Degrees   Left Shoulder Flexion 160 Degrees   Left Shoulder ABduction 158 Degrees   Left Shoulder  Internal Rotation 47 Degrees   Left Shoulder External Rotation 103 Degrees   Left Shoulder Horizontal ABduction 28 Degrees   Left Shoulder Horizontal ADduction 115 Degrees                   [facility] Adult PT Treatment/Exercise - 12/09/14 0001    Shoulder Exercises: Stretch   Corner Stretch 2 reps;20 seconds   Other Shoulder Stretches hor adduction 20 sec x 2   Manual Therapy   Manual Therapy Taping   Kinesiotex Ligament Correction   Kinesiotix   Ligament Correction 1 strap posterior to anterior AC joint  tape applied with shoulder 90/90 abduct/ER                PT Education - 12/09/14 1028    Education provided Yes   Education Details POC ,tape management, stretching   Person(s) Educated Patient   Methods Explanation;Demonstration;Tactile cues;Verbal cues;Handout   Comprehension Returned demonstration;Verbalized understanding          PT Short Term Goals - 12/09/14 1102    PT SHORT TERM GOAL #1   Title She will be independent with inital HEP   Time 3   Period Weeks   Status New   PT SHORT TERM GOAL #2   Title She will report 40% decreased pain with work tasks   Time 3   Period Weeks   Status New   PT SHORT TERM GOAL #3   Title She will improve flexion and ER to equal LT shoulder   Time 3   Period Weeks   Status New           PT Long Term Goals - 12/09/14 1103    PT LONG TERM GOAL #1   Title She will be independnet with all HEP as of last visit   Time 6   Period Weeks   Status New   PT LONG TERM GOAL #2   Title she will report 75% decr pain with work tasks   Time 6   Period Weeks   Status New   PT LONG TERM GOAL #3   Title she will report pain as intermittant.    Time 6   Period Weeks   Status New   PT LONG TERM GOAL #4   Title She will report able to hug husband without pain.    Time 6   Period Weeks   Status New  Plan - 12/09/14 1058    Clinical Impression Statement Sherry Wallace presents with some mild  decreased motion of RT shoulder and pain on the anterior aspect , tnederness on AC joint and along clavicle. 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  She should improve with PT   She reports bad reaction to steroid injections so we will not use Ionto for now    Pt will benefit from skilled therapeutic intervention in order to improve on the following deficits Pain;Impaired UE functional use;Decreased range of motion;Decreased activity tolerance   Rehab Potential Good   PT Frequency 2x / week   PT Duration 6 weeks   PT Treatment/Interventions Cryotherapy;Moist Heat;Ultrasound;Therapeutic exercise;Patient/family education;Taping;Dry needling;Manual techniques;Passive range of motion   PT Next Visit Plan Review stretching and benefits of taping. Modalities and manual tretment.    PT Home Exercise Plan Stretching   Consulted and Agree with Plan of Care Patient         Problem List Patient Active Problem List   Diagnosis Date Noted  . Chest pain, atypical 04/01/2013  . Essential hypertension, benign 03/25/2013  . History of anemia 11/07/2011  . GERD (gastroesophageal reflux disease) 11/07/2011  . History of migraine headaches 11/07/2011  . Palpitations 11/07/2011  . History of endometriosis 11/07/2011  . Genital herpes 11/07/2011  . Vitamin D deficiency 11/07/2011  . Menorrhagia 11/07/2011  . Dysmenorrhea 11/07/2011    Darrel Hoover PT 12/09/2014, 11:04 AM  [facility] [address] [place], [place], [address] Phone: [phone]   Fax:  [phone]  Name: Sherry Wallace MRN: [id number] Date of Birth: [birth year]

## 2014-12-13 ENCOUNTER — Ambulatory Visit: Payer: PRIVATE HEALTH INSURANCE | Attending: Family Medicine | Admitting: Physical Therapy

## 2014-12-13 DIAGNOSIS — R6889 Other general symptoms and signs: Secondary | ICD-10-CM | POA: Diagnosis present

## 2014-12-13 DIAGNOSIS — M25511 Pain in right shoulder: Secondary | ICD-10-CM | POA: Insufficient documentation

## 2014-12-13 DIAGNOSIS — M25611 Stiffness of right shoulder, not elsewhere classified: Secondary | ICD-10-CM | POA: Diagnosis present

## 2014-12-13 NOTE — Patient Instructions (Signed)
Over Head Pull: Narrow Grip       On back, knees bent, feet flat, band across thighs, elbows straight but relaxed. Pull hands apart (start). Keeping elbows straight, bring arms up and over head, hands toward floor. Keep pull steady on band. Hold momentarily. Return slowly, keeping pull steady, back to start. Repeat __10_ times. Band color _yellow_____   Side Pull: Double Arm   On back, knees bent, feet flat. Arms perpendicular to body, shoulder level, elbows straight but relaxed. Pull arms out to sides, elbows straight. Resistance band comes across collarbones, hands toward floor. Hold momentarily. Slowly return to starting position. Repeat __10_ times. Band color _yellow____   Sash   On back, knees bent, feet flat, left hand on left hip, right hand above left. Pull right arm DIAGONALLY (hip to shoulder) across chest. Bring right arm along head toward floor. Hold momentarily. Slowly return to starting position. Repeat 10___ times. Do with left arm. Band color _yellow_____   Shoulder Rotation: Double Arm   On back, knees bent, feet flat, elbows tucked at sides, bent 90, hands palms up. Pull hands apart and down toward floor, keeping elbows near sides. Hold momentarily. Slowly return to starting position. Repeat _10__ times. Band color ___yellow___

## 2014-12-15 NOTE — Therapy (Signed)
[place], [place], [address] Phone: [phone]   Fax:  [phone]  Physical Therapy Treatment  Patient Details  Name: Sherry Wallace MRN: [id number] Date of Birth: [birth year] Referring Provider: Joni Fears, MD  Encounter Date: 12/13/2014  Visit number: 2 Number of visits: 12 Date of re-evaluation: 01/20/2015 Activity tolerance - well Behavior - WFL for tasks    Past Medical History  Diagnosis Date  . GERD (gastroesophageal reflux disease)   . Anemia   . Dysmenorrhea   . Genital herpes   . History of endometriosis   . History of migraine headaches   . Menorrhagia   . Palpitations   . Vitamin D deficiency   . Esophageal reflux   . Renal calculus   . H/O Clostridium difficile infection 1993    from clindamycin  . Essential hypertension, benign     Past Surgical History  Procedure Laterality Date  . Cesarean section      X2  . Appendectomy    . Cosmetic surgery      rhinoplasty  . Knee arthroplasty    . Tonsillectomy and adenoidectomy     Exercise:  Supine  Bands for:  Narrow grip flexion, both 10 X yellow band, cues needed ER, both 5 X no band , 10 X with yellow band. Sash 10 X each side, cues, Horizontal abduction 10 X, yellow band.  Modalities: [place] 3 mHz, 100% , 8 minutes, shoulder for pain.    There were no vitals filed for this visit.  Visit Diagnosis:  Pain in joint of right shoulder  Stiffness of shoulder joint, right  Activity intolerance   end of session:  Progress toward home exercise goals,  Tape:  Not sure if it helped or not.   No new goals met Next visit:  Review band exercises, assess [place]                              PT Short Term Goals - 12/09/14 1102    PT SHORT TERM GOAL #1   Title She will be independent with inital HEP   Time 3   Period Weeks   Status New   PT SHORT TERM GOAL #2   Title She will report 40% decreased pain  with work tasks   Time 3   Period Weeks   Status New   PT SHORT TERM GOAL #3   Title She will improve flexion and ER to equal LT shoulder   Time 3   Period Weeks   Status New           PT Long Term Goals - 12/09/14 1103    PT LONG TERM GOAL #1   Title She will be independnet with all HEP as of last visit   Time 6   Period Weeks   Status New   PT LONG TERM GOAL #2   Title she will report 75% decr pain with work tasks   Time 6   Period Weeks   Status New   PT LONG TERM GOAL #3   Title she will report pain as intermittant.    Time 6   Period Weeks   Status New   PT LONG TERM GOAL #4   Title She will report able to hug husband without pain.    Time 6   Period Weeks   Status New   PT LONG TERM GOAL #5  Title FOTO score  to improve to 25% or less   Time 6   Period Weeks   Status New               Problem List Patient Active Problem List   Diagnosis Date Noted  . Chest pain, atypical 04/01/2013  . Essential hypertension, benign 03/25/2013  . History of anemia 11/07/2011  . GERD (gastroesophageal reflux disease) 11/07/2011  . History of migraine headaches 11/07/2011  . Palpitations 11/07/2011  . History of endometriosis 11/07/2011  . Genital herpes 11/07/2011  . Vitamin D deficiency 11/07/2011  . Menorrhagia 11/07/2011  . Dysmenorrhea 11/07/2011    HARRIS,KAREN 12/15/2014, 1:59 PM  [facility] [address] [place], [place], [address] Phone: [phone]   Fax:  [phone]  Name: Sherry Wallace MRN: [id number] Date of Birth: 11/05/1965    Melvenia Needles, PTA 12/15/2014 1:59 PM Phone: [phone] Fax: [phone]

## 2014-12-16 ENCOUNTER — Ambulatory Visit: Payer: PRIVATE HEALTH INSURANCE | Admitting: Physical Therapy

## 2014-12-16 DIAGNOSIS — M25511 Pain in right shoulder: Secondary | ICD-10-CM | POA: Diagnosis not present

## 2014-12-16 DIAGNOSIS — R6889 Other general symptoms and signs: Secondary | ICD-10-CM

## 2014-12-16 NOTE — Therapy (Signed)
[place], [place], [address] Phone: [phone]   Fax:  [phone]  Physical Therapy Treatment  Patient Details  Name: Sherry Wallace MRN: [id number] Date of Birth: 12-May-1965 Referring Provider: Joni Fears, MD  Encounter Date: 12/16/2014      PT End of Session - 12/16/14 1145    Visit Number 3   Number of Visits 12   Date for PT Re-Evaluation 01/20/15   PT Start Time 1101   PT Stop Time 1155   PT Time Calculation (min) 54 min   Activity Tolerance Patient limited by pain   Behavior During Therapy [facility] for tasks assessed/performed      Past Medical History  Diagnosis Date  . GERD (gastroesophageal reflux disease)   . Anemia   . Dysmenorrhea   . Genital herpes   . History of endometriosis   . History of migraine headaches   . Menorrhagia   . Palpitations   . Vitamin D deficiency   . Esophageal reflux   . Renal calculus   . H/O Clostridium difficile infection 1993    from clindamycin  . Essential hypertension, benign     Past Surgical History  Procedure Laterality Date  . Cesarean section      X2  . Appendectomy    . Cosmetic surgery      rhinoplasty  . Knee arthroplasty    . Tonsillectomy and adenoidectomy      There were no vitals filed for this visit.  Visit Diagnosis:  Pain in joint of right shoulder  Activity intolerance      Subjective Assessment - 12/16/14 1138    Subjective Shoulder was uncomfortable after the last ,  found herself guarding  new since last visit.  Not able to use Voltarin gel as prescribed (every 4 hours)  Had a massage yesterday.  Upper traps improved , shoulder is notr   Currently in Pain? Yes  she did not give an answer. varies   Pain Location Shoulder   Pain Orientation Right   Pain Descriptors / Indicators Aching   Pain Radiating Towards mid deltoid   Pain Frequency Constant   Aggravating Factors  PT?, work    Pain Relieving Factors rest.                            [facility] Adult PT Treatment/Exercise - 12/16/14 1142    Shoulder Exercises: Supine   Other Supine Exercises decompression 5 minutes  3 pillows needed under arm RT, neck roll and 1 pillow for head.  Tried shoulder press painful and increases with reps.  so stopped.     Cryotherapy   Number Minutes Cryotherapy 20 Minutes   Cryotherapy Location Shoulder   Type of Cryotherapy --  vasopneumatic, cold only   Electrical Stimulation   Electrical Stimulation Location Shoulder RT   Electrical Stimulation Action IFC   Electrical Stimulation Parameters 7   Electrical Stimulation Goals Pain                PT Education - 12/16/14 1145    Education provided Yes   Education Details Decompression.  Pain, inflamation   Person(s) Educated Patient   Methods Explanation;Handout   Comprehension Verbalized understanding;Returned demonstration          PT Short Term Goals - 12/09/14 1102    PT SHORT TERM GOAL #1   Title She will be independent with inital HEP  Time 3   Period Weeks   Status New   PT SHORT TERM GOAL #2   Title She will report 40% decreased pain with work tasks   Time 3   Period Weeks   Status New   PT SHORT TERM GOAL #3   Title She will improve flexion and ER to equal LT shoulder   Time 3   Period Weeks   Status New           PT Long Term Goals - 12/09/14 1103    PT LONG TERM GOAL #1   Title She will be independnet with all HEP as of last visit   Time 6   Period Weeks   Status New   PT LONG TERM GOAL #2   Title she will report 75% decr pain with work tasks   Time 6   Period Weeks   Status New   PT LONG TERM GOAL #3   Title she will report pain as intermittant.    Time 6   Period Weeks   Status New   PT LONG TERM GOAL #4   Title She will report able to hug husband without pain.    Time 6   Period Weeks   Status New   PT LONG TERM GOAL #5   Title FOTO score  to improve to 25% or less   Time 6   Period  Weeks   Status New               Plan - 12/16/14 1147    Clinical Impression Statement The most gentle exercises not tolerated today,  Opted for pain control.   PT Next Visit Plan assess treatment.  Work on pain control   PT Home Exercise Plan decompression #1, use ice   Consulted and Agree with Plan of Care Patient        Problem List Patient Active Problem List   Diagnosis Date Noted  . Chest pain, atypical 04/01/2013  . Essential hypertension, benign 03/25/2013  . History of anemia 11/07/2011  . GERD (gastroesophageal reflux disease) 11/07/2011  . History of migraine headaches 11/07/2011  . Palpitations 11/07/2011  . History of endometriosis 11/07/2011  . Genital herpes 11/07/2011  . Vitamin D deficiency 11/07/2011  . Menorrhagia 11/07/2011  . Dysmenorrhea 11/07/2011    HARRIS,KAREN 12/16/2014, 11:51 AM  [facility] [address] [place], [place], [address] Phone: [phone]   Fax:  [phone]  Name: Sherry Wallace MRN: [id number] Date of Birth: [birth year]    Melvenia Needles, PTA 12/16/2014 11:51 AM Phone: [phone] Fax: [phone]

## 2014-12-16 NOTE — Patient Instructions (Addendum)
Decompression Position with pillows .  Daily 5 to 15 minutes.  Avoid any pain. Stop band scapular exercises.Home Modalities: Ice  Methods Cold Pack: Wrap with moist towel before applying. Leave on for10-20 ___ minutes.   Copyright  VHI. All rights reserved.

## 2014-12-20 ENCOUNTER — Ambulatory Visit: Payer: PRIVATE HEALTH INSURANCE | Admitting: Physical Therapy

## 2014-12-20 DIAGNOSIS — M25511 Pain in right shoulder: Secondary | ICD-10-CM

## 2014-12-20 DIAGNOSIS — R6889 Other general symptoms and signs: Secondary | ICD-10-CM

## 2014-12-20 DIAGNOSIS — M25611 Stiffness of right shoulder, not elsewhere classified: Secondary | ICD-10-CM

## 2014-12-20 NOTE — Therapy (Signed)
[place], [place], [address] Phone: [phone]   Fax:  [phone]  Physical Therapy Treatment  Patient Details  Name: Sherry Wallace MRN: [id number] Date of Birth: 1965/11/21 Referring Provider: Joni Fears, MD  Encounter Date: 12/20/2014      PT End of Session - 12/20/14 1149    Visit Number 4   Number of Visits 12   Date for PT Re-Evaluation 01/20/15   Activity Tolerance Patient tolerated treatment well   Behavior During Therapy [facility] for tasks assessed/performed      Past Medical History  Diagnosis Date  . GERD (gastroesophageal reflux disease)   . Anemia   . Dysmenorrhea   . Genital herpes   . History of endometriosis   . History of migraine headaches   . Menorrhagia   . Palpitations   . Vitamin D deficiency   . Esophageal reflux   . Renal calculus   . H/O Clostridium difficile infection 1993    from clindamycin  . Essential hypertension, benign     Past Surgical History  Procedure Laterality Date  . Cesarean section      X2  . Appendectomy    . Cosmetic surgery      rhinoplasty  . Knee arthroplasty    . Tonsillectomy and adenoidectomy      There were no vitals filed for this visit.  Visit Diagnosis:  Pain in joint of right shoulder  Activity intolerance  Stiffness of shoulder joint, right      Subjective Assessment - 12/20/14 0853    Subjective Definite difference after last session, electricity really seemed to help.    Currently in Pain? Yes   Pain Score 3    Pain Location Shoulder   Pain Orientation Right   Pain Descriptors / Indicators Aching   Aggravating Factors  work, reaching   Pain Relieving Factors last session,                          [facility] Adult PT Treatment/Exercise - 12/20/14 0001    Shoulder Exercises: Isometric Strengthening   Flexion 5X5";Other (comment)  2 sets   Extension 5X5";Other (comment)  2 sets   External Rotation  5X5";Other (comment)  2 sets   Internal Rotation 5X5";Other (comment)  2 sets   Cryotherapy   Number Minutes Cryotherapy 15 Minutes   Cryotherapy Location Shoulder   Type of Cryotherapy Other (comment)  Game Ready cold only 32 degrees   Electrical Stimulation   Electrical Stimulation Location Shoulder RT   Electrical Stimulation Action IFC   Electrical Stimulation Parameters to patient comfort   Electrical Stimulation Goals Pain                PT Education - 12/20/14 [phone]    Education provided Yes   Education Details HEP, posture   Person(s) Educated Patient   Methods Explanation;Handout   Comprehension Verbalized understanding;Returned demonstration          PT Short Term Goals - 12/09/14 1102    PT SHORT TERM GOAL #1   Title She will be independent with inital HEP   Time 3   Period Weeks   Status New   PT SHORT TERM GOAL #2   Title She will report 40% decreased pain with work tasks   Time 3   Period Weeks   Status New   PT SHORT TERM GOAL #3   Title She will improve flexion and  ER to equal LT shoulder   Time 3   Period Weeks   Status New           PT Long Term Goals - 12/09/14 1103    PT LONG TERM GOAL #1   Title She will be independnet with all HEP as of last visit   Time 6   Period Weeks   Status New   PT LONG TERM GOAL #2   Title she will report 75% decr pain with work tasks   Time 6   Period Weeks   Status New   PT LONG TERM GOAL #3   Title she will report pain as intermittant.    Time 6   Period Weeks   Status New   PT LONG TERM GOAL #4   Title She will report able to hug husband without pain.    Time 6   Period Weeks   Status New   PT LONG TERM GOAL #5   Title FOTO score  to improve to 25% or less   Time 6   Period Weeks   Status New               Plan - 12/20/14 1150    PT Next Visit Plan assess isometrics, gently strengthening as tolerated, modalities as needed.    PT Home Exercise Plan increased isometric  resistence    Consulted and Agree with Plan of Care Patient        Problem List Patient Active Problem List   Diagnosis Date Noted  . Chest pain, atypical 04/01/2013  . Essential hypertension, benign 03/25/2013  . History of anemia 11/07/2011  . GERD (gastroesophageal reflux disease) 11/07/2011  . History of migraine headaches 11/07/2011  . Palpitations 11/07/2011  . History of endometriosis 11/07/2011  . Genital herpes 11/07/2011  . Vitamin D deficiency 11/07/2011  . Menorrhagia 11/07/2011  . Dysmenorrhea 11/07/2011    Linard Millers, PT 12/20/2014, 11:58 AM  [facility] [address] [place], [place], [address] Phone: [phone]   Fax:  [phone]  Name: Sherry Wallace MRN: [id number] Date of Birth: 11/10/1965

## 2014-12-23 ENCOUNTER — Ambulatory Visit: Payer: PRIVATE HEALTH INSURANCE | Admitting: Physical Therapy

## 2014-12-23 DIAGNOSIS — M25611 Stiffness of right shoulder, not elsewhere classified: Secondary | ICD-10-CM

## 2014-12-23 DIAGNOSIS — M25511 Pain in right shoulder: Secondary | ICD-10-CM

## 2014-12-23 DIAGNOSIS — R6889 Other general symptoms and signs: Secondary | ICD-10-CM

## 2014-12-23 NOTE — Therapy (Addendum)
[place], [place], [address] Phone: [phone]   Fax:  [phone]  Physical Therapy Treatment  Patient Details  Name: Sherry Wallace MRN: [id number] Date of Birth: 1965/04/16 Referring Provider: Joni Fears, MD  Encounter Date: 12/23/2014      PT End of Session - 12/23/14 1249    Visit Number 5   Number of Visits 12   Date for PT Re-Evaluation 01/20/15   PT Start Time 1103   PT Stop Time 3335   PT Time Calculation (min) 45 min   Activity Tolerance Patient tolerated treatment well;No increased pain   Behavior During Therapy [facility] for tasks assessed/performed      Past Medical History  Diagnosis Date  . GERD (gastroesophageal reflux disease)   . Anemia   . Dysmenorrhea   . Genital herpes   . History of endometriosis   . History of migraine headaches   . Menorrhagia   . Palpitations   . Vitamin D deficiency   . Esophageal reflux   . Renal calculus   . H/O Clostridium difficile infection 1993    from clindamycin  . Essential hypertension, benign     Past Surgical History  Procedure Laterality Date  . Cesarean section      X2  . Appendectomy    . Cosmetic surgery      rhinoplasty  . Knee arthroplasty    . Tonsillectomy and adenoidectomy      There were no vitals filed for this visit.  Visit Diagnosis:  Pain in joint of right shoulder  Activity intolerance  Stiffness of shoulder joint, right      Subjective Assessment - 12/23/14 1105    Subjective Doing about the same really. Tried to do the exercises. Feeling like I'm not as distracted by the pain.    Currently in Pain? Yes   Pain Score 2    Pain Location Shoulder   Pain Orientation Right   Pain Descriptors / Indicators Aching;Burning   Aggravating Factors  moving arm around, reaching   Pain Relieving Factors not moving the arm around                         [facility] Adult PT Treatment/Exercise - 12/23/14  0001    Shoulder Exercises: Seated   Row Strengthening;Both;10 reps   Theraband Level (Shoulder Row) Level 1 (Yellow)   External Rotation Strengthening;Right;10 reps   Theraband Level (Shoulder External Rotation) Level 1 (Yellow)   Internal Rotation Strengthening;Right;10 reps   Theraband Level (Shoulder Internal Rotation) Level 1 (Yellow)   Flexion AROM;Right;10 reps   Flexion Limitations Rt 146 degrees   Shoulder Exercises: Isometric Strengthening   External Rotation 5X5"   Internal Rotation 5X5"   Modalities   Modalities Electrical Stimulation   Electrical Stimulation   Electrical Stimulation Location Shoulder RT   Electrical Stimulation Action IFC   Electrical Stimulation Parameters intensity to patient comfort   Electrical Stimulation Goals Pain   Manual Therapy   Manual therapy comments tender at lateral detloid, inferior aspect of acromiom                PT Education - 12/23/14 1249    Education provided Yes   Education Details Shoulder mechanics and role of rotator cuff, general PT POC   Person(s) Educated Patient   Methods Explanation;Demonstration;Tactile cues;Verbal cues;Handout   Comprehension Verbalized understanding;Returned demonstration          PT Short  Term Goals - 12/09/14 1102    PT SHORT TERM GOAL #1   Title She will be independent with inital HEP   Time 3   Period Weeks   Status New   PT SHORT TERM GOAL #2   Title She will report 40% decreased pain with work tasks   Time 3   Period Weeks   Status New   PT SHORT TERM GOAL #3   Title She will improve flexion and ER to equal LT shoulder   Time 3   Period Weeks   Status New           PT Long Term Goals - 12/09/14 1103    PT LONG TERM GOAL #1   Title She will be independnet with all HEP as of last visit   Time 6   Period Weeks   Status New   PT LONG TERM GOAL #2   Title she will report 75% decr pain with work tasks   Time 6   Period Weeks   Status New   PT LONG TERM GOAL #3    Title she will report pain as intermittant.    Time 6   Period Weeks   Status New   PT LONG TERM GOAL #4   Title She will report able to hug husband without pain.    Time 6   Period Weeks   Status New   PT LONG TERM GOAL #5   Title FOTO score  to improve to 25% or less   Time 6   Period Weeks   Status New               Plan - 12/23/14 1250    Clinical Impression Statement Able to progress to gently rotator cuff and scapular stabilization exercises. Extensive education on role of rotator cuff and scapula and shoulder pain. Patient grateful for explanation. Anticipate continuation of active treatment progression as tolerated.    PT Next Visit Plan Review HEP, incresed range of motion with strengthening. Add shoulder flexion.    PT Home Exercise Plan Review t-band exercises   Consulted and Agree with Plan of Care Patient        Problem List Patient Active Problem List   Diagnosis Date Noted  . Chest pain, atypical 04/01/2013  . Essential hypertension, benign 03/25/2013  . History of anemia 11/07/2011  . GERD (gastroesophageal reflux disease) 11/07/2011  . History of migraine headaches 11/07/2011  . Palpitations 11/07/2011  . History of endometriosis 11/07/2011  . Genital herpes 11/07/2011  . Vitamin D deficiency 11/07/2011  . Menorrhagia 11/07/2011  . Dysmenorrhea 11/07/2011    Linard Millers, PT 12/23/2014, 12:55 PM  [facility] [address] [place], [place], [address] Phone: [phone]   Fax:  [phone]  Name: Sherry Wallace MRN: [id number] Date of Birth: [birth year]    PHYSICAL THERAPY DISCHARGE SUMMARY  Visits from Start of Care: 5  Current functional level related to goals / functional outcomes: See above   Remaining deficits: Unknown as she did not return after this visit   Education / Equipment: HEP Plan:                                                    Patient goals were not met.  Patient is being discharged due to not  returning since the last visit.  ?????    Lillette Boxer Chasse  PT  02/14/15     2:58 PM

## 2015-02-03 MED FILL — ATENOLOL 25 MG TABLET: 25 | 90 days supply | Qty: 90 | Fill #3

## 2015-03-08 ENCOUNTER — Ambulatory Visit: Payer: Self-pay | Admitting: Skilled Nursing Facility1

## 2015-04-01 MED FILL — PANTOPRAZOLE SOD DR 40 MG T: 40 | 90 days supply | Qty: 90 | Fill #0

## 2015-04-12 ENCOUNTER — Encounter: Payer: Self-pay | Admitting: Dietician

## 2015-04-12 ENCOUNTER — Encounter: Payer: 59 | Attending: Family Medicine | Admitting: Dietician

## 2015-04-12 VITALS — Ht 64.0 in | Wt 172.0 lb

## 2015-04-12 DIAGNOSIS — E663 Overweight: Secondary | ICD-10-CM

## 2015-04-12 NOTE — Progress Notes (Signed)
  Medical Nutrition Therapy:  Appt start time: 1000 end time:  1045.   Assessment:  Primary concerns today: Patient is here today with her husband.  She would like to improve her nutrition and lost weight. Hx of mother with idiopathic liver problems with liver transplant.  She wants to avoid liver problems herself.  She has lost 12 lbs in the past 2 months with changing her eating habits, increasing her activity, and tracking her intake.  Other hx includes:  GERD HTN, hypercholesterolemia, Vitamin D deficiency, anemia and h/p C.diff several years ago.  Weight hx: Lowest adult weight 92 lbs in college a lot of food intolerances and diarrhea at that time. Then 110 lbs but "not well nourished.  Liberalized her diet "bacon, donuts" and gained weight. Weight 150 lbs in 2000 prior to first pregnancy. Weight increased to 184 lbs.  Increased when she resumed work about 8 years ago.   130's 2011.    Patient lives with her husband and sons.  They eat out often.  Husband shops most and cook together.  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  She alternates between nights and days.  Preferred Learning Style:   No preference indicated   Learning Readiness:   Ready  Change in progress  MEDICATIONS: see list   DIETARY INTAKE:  Usual eating pattern includes 3 meals and 0-1 snacks per day.  Avoided foods include caffeine.    24-hr recall:  B ( AM): cereal (Life) with skim milk OR overnight oatmeal (almond milk, chia seeds, 1 tsp honey, vanilla) OR scrambled eggs  Snk ( AM): none or banana or greek yogurt with honey L ( PM): salad or fresh spring rolls or ham and cheddar with sesame crackers, occasional fruit Snk ( PM): none D ( PM): pasta with olive oil, butter, parmesan, broccoli without meat OR 2 cups vegetable soup with whole grain crackers, 1/2 avocado OR ham and veges OR Chick fil[employer] or [employer] OR pizza Snk ( PM): none or occasional cookie or 100 calorie candy bar   Beverages: water, hot tea, occasional regular soda (1x/week)  Usual physical activity: 5-7 days per week, walking, works with Physiological scientist 1x/week, strength training 1 x/week, yoga 1x/week.  Estimated energy needs: 1500 calories 170 g carbohydrates 112 g protein 42 g fat  Progress Towards Goal(s):  In progress.   Nutritional Diagnosis:  NB-1.1 Food and nutrition-related knowledge deficit As related to balance of energy intake and expenditure.  As evidenced by overweight.    Intervention:  Nutrition counseling/education related to mindful eating, healthy eating.  Continue your mindfulness in eating! Small portion of protein with each meal and snack. Continue your active lifestyle!  Teaching Method Utilized:  Visual Auditory Hands on  Handouts given during visit include:  My plate  Snack list  Meal plan card  Barriers to learning/adherence to lifestyle change: none  Demonstrated degree of understanding via:  Teach Back   Monitoring/Evaluation:  Dietary intake, exercise, and body weight prn.

## 2015-04-12 NOTE — Patient Instructions (Signed)
Continue your mindfulness in eating! Small portion of protein with each meal and snack. Continue your active lifestyle!

## 2015-04-27 DIAGNOSIS — L820 Inflamed seborrheic keratosis: Secondary | ICD-10-CM | POA: Diagnosis not present

## 2015-04-27 DIAGNOSIS — Z85828 Personal history of other malignant neoplasm of skin: Secondary | ICD-10-CM | POA: Diagnosis not present

## 2015-04-27 DIAGNOSIS — L219 Seborrheic dermatitis, unspecified: Secondary | ICD-10-CM | POA: Diagnosis not present

## 2015-05-09 ENCOUNTER — Other Ambulatory Visit: Payer: Self-pay | Admitting: Cardiology

## 2015-05-10 MED FILL — ATENOLOL 25 MG TABLET: 25 | 90 days supply | Qty: 90 | Fill #0

## 2015-05-12 DIAGNOSIS — K219 Gastro-esophageal reflux disease without esophagitis: Secondary | ICD-10-CM | POA: Diagnosis not present

## 2015-05-12 DIAGNOSIS — Z13220 Encounter for screening for lipoid disorders: Secondary | ICD-10-CM | POA: Diagnosis not present

## 2015-05-12 DIAGNOSIS — R739 Hyperglycemia, unspecified: Secondary | ICD-10-CM | POA: Diagnosis not present

## 2015-05-12 DIAGNOSIS — E559 Vitamin D deficiency, unspecified: Secondary | ICD-10-CM | POA: Diagnosis not present

## 2015-05-12 DIAGNOSIS — Z0000 Encounter for general adult medical examination without abnormal findings: Secondary | ICD-10-CM | POA: Diagnosis not present

## 2015-05-12 DIAGNOSIS — R002 Palpitations: Secondary | ICD-10-CM | POA: Diagnosis not present

## 2015-07-05 MED FILL — PANTOPRAZOLE SOD DR 40 MG T: 40 | 30 days supply | Qty: 30 | Fill #0

## 2015-07-09 ENCOUNTER — Telehealth: Payer: 59 | Admitting: Nurse Practitioner

## 2015-07-09 DIAGNOSIS — N3000 Acute cystitis without hematuria: Secondary | ICD-10-CM | POA: Diagnosis not present

## 2015-07-09 MED ORDER — NITROFURANTOIN MONOHYD MACRO 100 MG PO CAPS: 100.0000 mg | ORAL_CAPSULE | Freq: Two times a day (BID) | ORAL | Status: DC

## 2015-07-09 NOTE — Progress Notes (Signed)

## 2015-07-21 DIAGNOSIS — D2261 Melanocytic nevi of right upper limb, including shoulder: Secondary | ICD-10-CM | POA: Diagnosis not present

## 2015-07-21 DIAGNOSIS — D485 Neoplasm of uncertain behavior of skin: Secondary | ICD-10-CM | POA: Diagnosis not present

## 2015-07-21 DIAGNOSIS — L820 Inflamed seborrheic keratosis: Secondary | ICD-10-CM | POA: Diagnosis not present

## 2015-08-08 ENCOUNTER — Other Ambulatory Visit: Payer: Self-pay | Admitting: Cardiology

## 2015-08-08 MED FILL — ATENOLOL 25 MG TABLET: 25 | 30 days supply | Qty: 30 | Fill #0

## 2015-08-08 MED FILL — PANTOPRAZOLE SOD DR 40 MG T: 40 | 30 days supply | Qty: 30 | Fill #1

## 2015-08-17 DIAGNOSIS — H524 Presbyopia: Secondary | ICD-10-CM | POA: Diagnosis not present

## 2015-08-17 DIAGNOSIS — H5213 Myopia, bilateral: Secondary | ICD-10-CM | POA: Diagnosis not present

## 2015-08-22 ENCOUNTER — Other Ambulatory Visit: Payer: Self-pay | Admitting: Obstetrics & Gynecology

## 2015-08-22 DIAGNOSIS — Z1231 Encounter for screening mammogram for malignant neoplasm of breast: Secondary | ICD-10-CM

## 2015-08-23 DIAGNOSIS — Z79899 Other long term (current) drug therapy: Secondary | ICD-10-CM | POA: Diagnosis not present

## 2015-08-23 DIAGNOSIS — K219 Gastro-esophageal reflux disease without esophagitis: Secondary | ICD-10-CM | POA: Diagnosis not present

## 2015-09-08 MED FILL — ATENOLOL 25 MG TABLET: 25 | 30 days supply | Qty: 30 | Fill #1

## 2015-09-08 MED FILL — PANTOPRAZOLE SOD DR 40 MG T: 40 | 30 days supply | Qty: 30 | Fill #2

## 2015-09-13 ENCOUNTER — Encounter: Payer: Self-pay | Admitting: Cardiology

## 2015-09-20 ENCOUNTER — Ambulatory Visit
Admission: RE | Admit: 2015-09-20 | Discharge: 2015-09-20 | Disposition: A | Discharge status: Home / Self Care | Payer: 59 | Source: Ambulatory Visit | Attending: Obstetrics & Gynecology | Admitting: Obstetrics & Gynecology

## 2015-09-20 DIAGNOSIS — Z1231 Encounter for screening mammogram for malignant neoplasm of breast: Secondary | ICD-10-CM | POA: Diagnosis not present

## 2015-09-22 ENCOUNTER — Other Ambulatory Visit: Payer: Self-pay | Admitting: Obstetrics & Gynecology

## 2015-09-22 DIAGNOSIS — R928 Other abnormal and inconclusive findings on diagnostic imaging of breast: Secondary | ICD-10-CM

## 2015-09-27 ENCOUNTER — Ambulatory Visit
Admission: RE | Admit: 2015-09-27 | Discharge: 2015-09-27 | Disposition: A | Discharge status: Home / Self Care | Payer: 59 | Source: Ambulatory Visit | Attending: Obstetrics & Gynecology | Admitting: Obstetrics & Gynecology

## 2015-09-27 ENCOUNTER — Other Ambulatory Visit: Payer: Self-pay | Admitting: Obstetrics & Gynecology

## 2015-09-27 DIAGNOSIS — N63 Unspecified lump in breast: Secondary | ICD-10-CM | POA: Diagnosis not present

## 2015-09-27 DIAGNOSIS — R928 Other abnormal and inconclusive findings on diagnostic imaging of breast: Secondary | ICD-10-CM

## 2015-09-28 ENCOUNTER — Encounter: Payer: Self-pay | Admitting: Cardiology

## 2015-09-28 ENCOUNTER — Encounter: Payer: Self-pay | Admitting: Obstetrics & Gynecology

## 2015-09-28 ENCOUNTER — Ambulatory Visit (INDEPENDENT_AMBULATORY_CARE_PROVIDER_SITE_OTHER): Payer: 59 | Admitting: Cardiology

## 2015-09-28 VITALS — BP 116/72 | HR 59 | Ht 64.0 in | Wt 153.4 lb

## 2015-09-28 DIAGNOSIS — I10 Essential (primary) hypertension: Secondary | ICD-10-CM | POA: Diagnosis not present

## 2015-09-28 DIAGNOSIS — N63 Unspecified lump in unspecified breast: Secondary | ICD-10-CM | POA: Insufficient documentation

## 2015-09-28 DIAGNOSIS — R0789 Other chest pain: Secondary | ICD-10-CM

## 2015-09-28 DIAGNOSIS — R002 Palpitations: Secondary | ICD-10-CM | POA: Diagnosis not present

## 2015-09-28 MED ORDER — ATENOLOL 25 MG PO TABS
25.0000 mg | ORAL_TABLET | Freq: Every day | ORAL | 3 refills | Status: DC
Start: 2015-09-28 — End: 2016-11-01

## 2015-09-28 MED FILL — ATENOLOL 25 MG TABLET: 25 | 30 days supply | Qty: 30 | Fill #0

## 2015-09-28 NOTE — Patient Instructions (Signed)
Your physician recommends that you continue on your current medications as directed. Please refer to the Current Medication list given to you today.   Your physician wants you to follow-up in: YEAR WITH DR TURNER You will receive a reminder letter in the mail two months in advance. If you don't receive a letter, please call our office to schedule the follow-up appointment.  

## 2015-09-28 NOTE — Progress Notes (Signed)
Cardiology Office Note    Date:  09/28/2015   ID:  SHANDELLE NOTARIANNI, DOB 1965/07/02, MRN [id number]  PCP:  Cari Caraway, MD  Cardiologist:  Fransico Him, MD   Chief Complaint  Patient presents with  . Chest Pain  . Hypertension    History of Present Illness:  Sherry Wallace is a 50 y.o. female with a history of palpitations related to caffeine intake and HTN. She is doing well.  She denies any LE edema, dizziness or syncope. She has not had any chest pain or pressure, SOB, DOE, PND or orthopnea.  She had an ETT remotely for atypical CP that was normal.  She has had a few episodes of palpitations (3 episodes) this past year.  1 episode occurred after being startled.  Another episode occurred after an aerobics class.     Past Medical History:  Diagnosis Date  . Anemia   . Dysmenorrhea   . Esophageal reflux   . Essential hypertension, benign   . Genital herpes   . GERD (gastroesophageal reflux disease)   . H/O Clostridium difficile infection 1993   from clindamycin  . History of endometriosis   . History of migraine headaches   . Menorrhagia   . Palpitations   . Renal calculus   . Vitamin D deficiency     Past Surgical History:  Procedure Laterality Date  . APPENDECTOMY    . CESAREAN SECTION     X2  . COSMETIC SURGERY     rhinoplasty  . KNEE ARTHROPLASTY    . TONSILLECTOMY AND ADENOIDECTOMY      Current Medications: Outpatient Medications Prior to Visit  Medication Sig Dispense Refill  . atenolol (TENORMIN) 25 MG tablet TAKE 1 TABLET BY MOUTH ONCE DAILY (PLEASE CALL TO SCHEDULE A ONE YEAR FOLLOW UP APPT) 90 tablet 0  . ibuprofen (ADVIL,MOTRIN) 800 MG tablet Take 1 tablet (800 mg total) by mouth every 8 (eight) hours as needed. 60 tablet 1  . pantoprazole (PROTONIX) 40 MG tablet Take 40 mg by mouth daily.    . valACYclovir (VALTREX) 500 MG tablet Take 1 tablet (500 mg total) by mouth 2 (two) times daily. (Patient taking differently: Take 500 mg by mouth as needed. )  180 tablet 3  . diclofenac sodium (VOLTAREN) 1 % GEL Apply topically 4 (four) times daily. Reported on 04/12/2015    . nitrofurantoin, macrocrystal-monohydrate, (MACROBID) 100 MG capsule Take 1 capsule (100 mg total) by mouth 2 (two) times daily. 1 po BId (Patient not taking: Reported on 09/28/2015) 14 capsule 0   No facility-administered medications prior to visit.      Allergies:   Fentanyl; Penicillins; and Sulfa antibiotics   Social History   Social History  . Marital status: Married    Spouse name: N/A  . Number of children: N/A  . Years of education: N/A   Social History Main Topics  . Smoking status: Never Smoker  . Smokeless tobacco: Never Used  . Alcohol use No  . Drug use:      Comment: rare 1-2 a month  . Sexual activity: Yes    Birth control/ protection: Surgical   Other Topics Concern  . None   Social History Narrative  . None     Family History:  The patient's family history includes Cancer in her maternal grandmother; Colon polyps in her mother; Heart disease in her maternal grandfather and paternal grandmother; Liver disease in her mother.   ROS:   Please see the history  of present illness.    ROS All other systems reviewed and are negative.  No flowsheet data found.     PHYSICAL EXAM:   VS:  BP 116/72   Pulse (!) 59   Ht 5\' 4"  (1.626 m)   Wt 153 lb 6.4 oz (69.6 kg)   BMI 26.33 kg/m    GEN: Well nourished, well developed, in no acute distress  HEENT: normal  Neck: no JVD, carotid bruits, or masses Cardiac: RRR; no murmurs, rubs, or gallops,no edema.  Intact distal pulses bilaterally.  Respiratory:  clear to auscultation bilaterally, normal work of breathing GI: soft, nontender, nondistended, + BS MS: no deformity or atrophy  Skin: warm and dry, no rash Neuro:  Alert and Oriented x 3, Strength and sensation are intact Psych: euthymic mood, full affect  Wt Readings from Last 3 Encounters:  09/28/15 153 lb 6.4 oz (69.6 kg)  04/12/15 172 lb (78  kg)  05/11/14 182 lb (82.6 kg)      Studies/Labs Reviewed:   EKG:  EKG is ordered today.  The ekg ordered today demonstrates sinus bradycardia at 59bpm with no ST changes and normal intervals  Recent Labs: 11/24/2014: TSH 1.189   Lipid Panel    Component Value Date/Time   CHOL 239 (H) 11/07/2011 1511   TRIG 80 11/07/2011 1511   HDL 63 11/07/2011 1511   CHOLHDL 3.8 11/07/2011 1511   VLDL 16 11/07/2011 1511   LDLCALC 160 (H) 11/07/2011 1511    Additional studies/ records that were reviewed today include:  none    ASSESSMENT:    1. Chest pain, atypical   2. Essential hypertension, benign   3. Palpitations      PLAN:  In order of problems listed above:  1. Atypical chest pain - this has resolved.   2. HTN - BP controlled on current meds.  Continue BB.   3. Palpitations improved after stopping caffeine intake.  She has only had 3 bouts of skipped heart beats in the past year. I have encouraged her to try to hydrate when she works out.      Medication Adjustments/Labs and Tests Ordered: Current medicines are reviewed at length with the patient today.  Concerns regarding medicines are outlined above.  Medication changes, Labs and Tests ordered today are listed in the Patient Instructions below.  There are no Patient Instructions on file for this visit.   Signed, Fransico Him, MD  09/28/2015 2:03 PM    [place], [place], [place]  [address] Phone: [phone]; Fax: [phone]

## 2015-09-29 ENCOUNTER — Encounter: Payer: Self-pay | Admitting: Obstetrics & Gynecology

## 2015-09-29 ENCOUNTER — Ambulatory Visit (INDEPENDENT_AMBULATORY_CARE_PROVIDER_SITE_OTHER): Payer: 59 | Admitting: Obstetrics & Gynecology

## 2015-09-29 VITALS — BP 126/81 | HR 69 | Ht 64.0 in | Wt 153.0 lb

## 2015-09-29 DIAGNOSIS — Z124 Encounter for screening for malignant neoplasm of cervix: Secondary | ICD-10-CM | POA: Diagnosis not present

## 2015-09-29 DIAGNOSIS — Z1151 Encounter for screening for human papillomavirus (HPV): Secondary | ICD-10-CM | POA: Diagnosis not present

## 2015-09-29 DIAGNOSIS — Z01419 Encounter for gynecological examination (general) (routine) without abnormal findings: Secondary | ICD-10-CM

## 2015-09-29 DIAGNOSIS — Z0000 Encounter for general adult medical examination without abnormal findings: Secondary | ICD-10-CM | POA: Diagnosis not present

## 2015-09-29 DIAGNOSIS — N951 Menopausal and female climacteric states: Secondary | ICD-10-CM

## 2015-09-29 NOTE — Progress Notes (Signed)
Subjective:     Sherry Wallace is a 50 y.o. female here for a routine exam.  Current complaints: being followed for left breast lump.  Has Mirena with no menses--last FSH = 70s.  Uses personal lubricant during intercourse with occasional discomfort.  .   Gynecologic History No LMP recorded. Patient is not currently having periods (Reason: IUD). Contraception: IUD Last Pap: 2012. Results were: normal Last mammogram: 2017. Results were: left breast cyst as above  Obstetric History OB History  Gravida Para Term Preterm AB Living  3 2     1 2   SAB TAB Ectopic Multiple Live Births    1          # Outcome Date GA Lbr Len/2nd Weight Sex Delivery Anes PTL Lv  3 Para           2 TAB           1 Para                The following portions of the patient's history were reviewed and updated as appropriate: allergies, current medications, past family history, past medical history, past social history, past surgical history and problem list.  Review of Systems Pertinent items noted in HPI and remainder of comprehensive ROS otherwise negative.    Objective:      Vitals:   09/29/15 1334  BP: 126/81  Pulse: 69  Weight: 153 lb (69.4 kg)  Height: 5\' 4"  (1.626 m)   Vitals:  WNL General appearance: alert, cooperative and no distress  HEENT: Normocephalic, without obvious abnormality, atraumatic Eyes: negative Throat: lips, mucosa, and tongue normal; teeth and gums normal  Respiratory: nml effort  CV: Regular rate and rhythm  Breasts:  Deferred due to recent exam at [place]  GI: Soft, non-tender; bowel sounds normal; no masses,  no organomegaly  GU: External Genitalia:  Tanner V, no lesion Urethra:  No prolapse   Vagina: Pale pink, no blood or discharge, IUD strings 3-4 cm  Cervix: No CMT, no lesion  Uterus:  Normal size and contour, non tender  Adnexa: Normal, no masses, non tender  Musculoskeletal: No edema, redness or tenderness in the calves or thighs  Skin: No lesions or rash   Lymphatic: Axillary adenopathy: none    Psychiatric: Normal mood and behavior     Assessment:    Healthy female exam.      Plan:    Contraception: IUD. Follow up in: 1 year. check TSH (histroy of thryoid nodule)  PCP monitors cholestrol and BP

## 2015-09-30 ENCOUNTER — Encounter: Payer: Self-pay | Admitting: Obstetrics & Gynecology

## 2015-09-30 LAB — FOLLICLE STIMULATING HORMONE: FSH: 86.9 m[IU]/mL

## 2015-09-30 LAB — TSH: TSH: 1.42 m[IU]/L

## 2015-10-04 LAB — CYTOLOGY - PAP

## 2015-10-12 MED FILL — PANTOPRAZOLE SOD DR 40 MG T: 40 | 90 days supply | Qty: 90 | Fill #0

## 2015-10-30 ENCOUNTER — Ambulatory Visit (INDEPENDENT_AMBULATORY_CARE_PROVIDER_SITE_OTHER): Payer: Self-pay | Admitting: Family Medicine

## 2015-10-30 VITALS — BP 110/68 | HR 64 | Temp 98.0°F | Ht 64.0 in | Wt 153.2 lb

## 2015-10-30 DIAGNOSIS — B029 Zoster without complications: Secondary | ICD-10-CM

## 2015-10-30 MED ORDER — FAMCICLOVIR 500 MG PO TABS: 500.0000 mg | ORAL_TABLET | Freq: Three times a day (TID) | ORAL | 1 refills | Status: AC

## 2015-10-30 NOTE — Patient Instructions (Addendum)
Shingles Shingles is an infection that causes a painful skin rash and fluid-filled blisters. Shingles is caused by the same virus that causes chickenpox. Shingles only develops in people who:  Have had chickenpox.  Have gotten the chickenpox vaccine. (This is rare.) The first symptoms of shingles may be itching, tingling, or pain in an area on your skin. A rash will follow in a few days or weeks. The rash is usually on one side of the body in a bandlike or beltlike pattern. Over time, the rash turns into fluid-filled blisters that break open, scab over, and dry up. Medicines may:  Help you manage pain.  Help you recover more quickly.  Help to prevent long-term problems. HOME CARE Medicines  Take medicines only as told by your doctor.  Apply an anti-itch or numbing cream to the affected area as told by your doctor. Blister and Rash Care  Take a cool bath or put cool compresses on the area of the rash or blisters as told by your doctor. This may help with pain and itching.  Keep your rash covered with a loose bandage (dressing). Wear loose-fitting clothing.  Keep your rash and blisters clean with mild soap and cool water or as told by your doctor.  Check your rash every day for signs of infection. These include redness, swelling, and pain that lasts or gets worse.  Do not pick your blisters.  Do not scratch your rash. General Instructions  Rest as told by your doctor.  Keep all follow-up visits as told by your doctor. This is important.  Until your blisters scab over, your infection can cause chickenpox in people who have never had it or been vaccinated against it. To prevent this from happening, avoid touching other people or being around other people, especially:  Babies.  Pregnant women.  Children who have eczema.  Elderly people who have transplants.  People who have chronic illnesses, such as leukemia or AIDS. GET HELP IF:  Your pain does not get better with  medicine.  Your pain does not get better after the rash heals.  Your rash looks infected. Signs of infection include:  Redness.  Swelling.  Pain that lasts or gets worse. GET HELP RIGHT AWAY IF:  The rash is on your face or nose.  You have pain in your face, pain around your eye area, or loss of feeling on one side of your face.  You have ear pain or you have ringing in your ear.  You have loss of taste.  Your condition gets worse.   This information is not intended to replace advice given to you by your health care provider. Make sure you discuss any questions you have with your health care provider.   Document Released: 06/13/2007 Document Revised: 01/15/2014 Document Reviewed: 10/06/2013 Elsevier Interactive Patient Education 2016 Elsevier Inc.  

## 2015-10-30 NOTE — Progress Notes (Signed)
   Subjective:    Patient ID: Sherry Wallace, female    DOB: 07/10/1965, 50 y.o.   MRN: [id number]  HPI Pt reports rash lt clavicle noted itchy/ burning sensation yesterday, mild headache, localized pain. Pt has not taken any medication. Denies rash in any other location.  Pt has hx of genital herpes/last taken antiviral in July 2017. Denies malignancies/chronic corticosteroids use/HIV/chemo/ Age > 70 + risk factor   Review of Systems  Constitutional: Negative.   Eyes: Negative.   Respiratory: Negative.   Cardiovascular: Negative.   Skin: Positive for rash.  Allergic/Immunologic: Negative.   Neurological: Positive for headaches.  Psychiatric/Behavioral: Negative.        Objective:   Physical Exam  Constitutional: She is oriented to person, place, and time. She appears well-developed and well-nourished.  HENT:  Head: Normocephalic and atraumatic.  Eyes: Conjunctivae and EOM are normal. Pupils are equal, round, and reactive to light.  No eye involvment  Neck: Normal range of motion. Neck supple.  Cardiovascular: Normal rate, regular rhythm and normal heart sounds.   Pulmonary/Chest: Effort normal and breath sounds normal.  Neurological: She is alert and oriented to person, place, and time.  Skin: Skin is warm and dry.  Blisters 2 cm lt clavicle, no other dermatome,localized pain,  pain level 3.  Psychiatric: She has a normal mood and affect. Her behavior is normal. Judgment and thought content normal.          Assessment & Plan:  Take Ibuprofen or tylenol. Calamine lotion. Pt not returning to work, has taken PAL for vacation, to follow Health at work policies. F/U with PCP after full tx  Pt has Valtrex at home, [facility]acy [facility] is closed today. instructed to take Valtrex 1000 mg q 8 hours today to start treatment. Pt will pick up prescription for 7 day treatment Famciclovir tomorrow. Pt verbalized understanding of tx plan.

## 2015-10-31 MED FILL — FAMCICLOVIR 500 MG TABLET: 500 | 7 days supply | Qty: 21 | Fill #0

## 2015-11-09 MED FILL — ATENOLOL 25 MG TABLET: 25 | 30 days supply | Qty: 30 | Fill #1

## 2015-12-15 MED FILL — ATENOLOL 25 MG TABLET: 25 | 30 days supply | Qty: 30 | Fill #2

## 2016-01-11 MED FILL — PANTOPRAZOLE SOD DR 40 MG T: 40 | 90 days supply | Qty: 90 | Fill #1

## 2016-01-23 MED FILL — ATENOLOL 50 MG TABLET: 50 | 30 days supply | Qty: 15 | Fill #0

## 2016-02-23 MED FILL — ATENOLOL 50 MG TABLET: 50 | 30 days supply | Qty: 15 | Fill #1

## 2016-03-26 MED FILL — ATENOLOL 50 MG TABLET: 50 | 30 days supply | Qty: 15 | Fill #2

## 2016-04-12 MED FILL — PANTOPRAZOLE SOD DR 40 MG T: 40 | 90 days supply | Qty: 90 | Fill #2

## 2016-04-30 MED FILL — ATENOLOL 50 MG TABLET: 50 | 30 days supply | Qty: 15 | Fill #3

## 2016-05-01 DIAGNOSIS — D225 Melanocytic nevi of trunk: Secondary | ICD-10-CM | POA: Diagnosis not present

## 2016-05-01 DIAGNOSIS — L821 Other seborrheic keratosis: Secondary | ICD-10-CM | POA: Diagnosis not present

## 2016-05-01 DIAGNOSIS — L988 Other specified disorders of the skin and subcutaneous tissue: Secondary | ICD-10-CM | POA: Diagnosis not present

## 2016-05-01 DIAGNOSIS — D485 Neoplasm of uncertain behavior of skin: Secondary | ICD-10-CM | POA: Diagnosis not present

## 2016-05-01 DIAGNOSIS — D1801 Hemangioma of skin and subcutaneous tissue: Secondary | ICD-10-CM | POA: Diagnosis not present

## 2016-05-01 DIAGNOSIS — L814 Other melanin hyperpigmentation: Secondary | ICD-10-CM | POA: Diagnosis not present

## 2016-05-01 DIAGNOSIS — Z85828 Personal history of other malignant neoplasm of skin: Secondary | ICD-10-CM | POA: Diagnosis not present

## 2016-05-29 MED FILL — ATENOLOL 50 MG TABLET: 50 | 30 days supply | Qty: 15 | Fill #4

## 2016-06-27 MED FILL — ATENOLOL 50 MG TABLET: 50 | 30 days supply | Qty: 15 | Fill #5

## 2016-07-13 MED FILL — PANTOPRAZOLE SOD DR 40 MG T: 40 | 30 days supply | Qty: 30 | Fill #3

## 2016-07-20 MED FILL — ATENOLOL 25 MG TABLET: 25 | 90 days supply | Qty: 90 | Fill #0

## 2016-08-08 MED FILL — PANTOPRAZOLE SOD DR 40 MG T: 40 | 90 days supply | Qty: 90 | Fill #0

## 2016-09-06 ENCOUNTER — Telehealth: Payer: Self-pay | Admitting: *Deleted

## 2016-09-06 DIAGNOSIS — Z8669 Personal history of other diseases of the nervous system and sense organs: Secondary | ICD-10-CM

## 2016-09-06 MED ORDER — IBUPROFEN 800 MG PO TABS: 800.0000 mg | ORAL_TABLET | Freq: Three times a day (TID) | ORAL | 1 refills | Status: DC | PRN

## 2016-09-06 MED FILL — IBUPROFEN 800 MG TAB: 800 | 20 days supply | Qty: 60 | Fill #0

## 2016-09-06 NOTE — Telephone Encounter (Signed)
RF request for Motrin 800 mg sent to [facility] outpatient pharmacy per Dr Hulan Fray

## 2016-09-26 ENCOUNTER — Encounter: Payer: Self-pay | Admitting: *Deleted

## 2016-10-09 ENCOUNTER — Ambulatory Visit (INDEPENDENT_AMBULATORY_CARE_PROVIDER_SITE_OTHER): Payer: 59 | Admitting: Cardiology

## 2016-10-09 ENCOUNTER — Encounter: Payer: Self-pay | Admitting: Cardiology

## 2016-10-09 VITALS — BP 120/80 | HR 61 | Ht 64.0 in | Wt 162.6 lb

## 2016-10-09 DIAGNOSIS — R002 Palpitations: Secondary | ICD-10-CM | POA: Diagnosis not present

## 2016-10-09 DIAGNOSIS — T733XXA Exhaustion due to excessive exertion, initial encounter: Secondary | ICD-10-CM

## 2016-10-09 DIAGNOSIS — I10 Essential (primary) hypertension: Secondary | ICD-10-CM

## 2016-10-09 NOTE — Progress Notes (Signed)
Cardiology Office Note:    Date:  10/09/2016   ID:  Sherry Wallace, DOB 08/23/1965, MRN [id number]  PCP:  Cari Caraway, MD  Cardiologist:  Fransico Him, MD   Referring MD: Cari Caraway, MD   Chief Complaint  Patient presents with  . Hypertension  . Irregular Heart Beat    History of Present Illness:    Sherry Wallace is a 51 y.o. female with a hx of palpitations related to caffeine intake and HTN.  She is here today for followup and is doing well.  She denies any anginal chest pain or pressure, SOB, DOE, PND, orthopnea, LE edema, dizziness or syncope. She is compliant with her meds and is tolerating meds with no SE.  She complains that she has recently been having exertional fatigue and it takes her longer to get back to normal after a workout.  She feels like her heart is working harder when exercising.  She notices occasional skipped beats especially at night and feels that her heart is working harder after she has hiked.   Past Medical History:  Diagnosis Date  . Anemia   . Dysmenorrhea   . Esophageal reflux   . Essential hypertension, benign   . Genital herpes   . GERD (gastroesophageal reflux disease)   . H/O Clostridium difficile infection 1993   from clindamycin  . History of endometriosis   . History of migraine headaches   . Menorrhagia   . Palpitations   . Renal calculus   . Vitamin D deficiency     Past Surgical History:  Procedure Laterality Date  . APPENDECTOMY    . CESAREAN SECTION     X2  . COSMETIC SURGERY     rhinoplasty  . KNEE ARTHROPLASTY    . TONSILLECTOMY AND ADENOIDECTOMY      Current Medications: Current Meds  Medication Sig  . atenolol (TENORMIN) 25 MG tablet Take 1 tablet (25 mg total) by mouth daily.  Marland Kitchen ibuprofen (ADVIL,MOTRIN) 800 MG tablet Take 1 tablet (800 mg total) by mouth every 8 (eight) hours as needed.  . pantoprazole (PROTONIX) 40 MG tablet Take 40 mg by mouth daily.  . valACYclovir (VALTREX) 500 MG tablet Take 1 tablet (500  mg total) by mouth 2 (two) times daily.     Allergies:   Fentanyl; Penicillins; and Sulfa antibiotics   Social History   Social History  . Marital status: Married    Spouse name: N/A  . Number of children: N/A  . Years of education: N/A   Social History Main Topics  . Smoking status: Never Smoker  . Smokeless tobacco: Never Used  . Alcohol use Yes     Comment: rarely  . Drug use: No  . Sexual activity: Yes    Birth control/ protection: Surgical, IUD   Other Topics Concern  . None   Social History Narrative  . None     Family History: The patient's family history includes Cancer in her maternal grandmother; Cirrhosis in her mother; Colon polyps in her mother; Heart disease in her maternal grandfather and paternal grandmother; Liver disease in her mother.  ROS:   Please see the history of present illness.    ROS  All other systems reviewed and negative.   EKGs/Labs/Other Studies Reviewed:    The following studies were reviewed today: none  EKG:  EKG is  ordered today.  The ekg ordered today demonstrates sinus bradycardiaat 58bpm with no ST changes  Recent Labs: No results found for  requested labs within last 8760 hours.   Recent Lipid Panel    Component Value Date/Time   CHOL 239 (H) 11/07/2011 1511   TRIG 80 11/07/2011 1511   HDL 63 11/07/2011 1511   CHOLHDL 3.8 11/07/2011 1511   VLDL 16 11/07/2011 1511   LDLCALC 160 (H) 11/07/2011 1511    Physical Exam:    VS:  BP 120/80   Pulse 61   Ht 5\' 4"  (1.626 m)   Wt 162 lb 9.6 oz (73.8 kg)   SpO2 97%   BMI 27.91 kg/m     Wt Readings from Last 3 Encounters:  10/09/16 162 lb 9.6 oz (73.8 kg)  10/30/15 153 lb 3.2 oz (69.5 kg)  09/29/15 153 lb (69.4 kg)     GEN:  Well nourished, well developed in no acute distress HEENT: Normal NECK: No JVD; No carotid bruits LYMPHATICS: No lymphadenopathy CARDIAC: RRR, no murmurs, rubs, gallops RESPIRATORY:  Clear to auscultation without rales, wheezing or rhonchi    ABDOMEN: Soft, non-tender, non-distended MUSCULOSKELETAL:  No edema; No deformity  SKIN: Warm and dry NEUROLOGIC:  Alert and oriented x 3 PSYCHIATRIC:  Normal affect   ASSESSMENT:    1. Palpitations   2. Essential hypertension, benign   3. Fatigue due to excessive exertion, initial encounter    PLAN:    In order of problems listed above:  1.  Palpitations - She has noticed some skipped beats at night only and fairly well controlled on BB.  2.  HTN - her BP is well controlled on exam today.  She will continue on Atenolol 25mg  daily.    3.  Exertional intolerance - I will get an ETT to assess HR response and rule out ischemia. I will check a TSH, BMET and CBC.    Medication Adjustments/Labs and Tests Ordered: Current medicines are reviewed at length with the patient today.  Concerns regarding medicines are outlined above.  No orders of the defined types were placed in this encounter.  No orders of the defined types were placed in this encounter.   Signed, Fransico Him, MD  10/09/2016 10:16 AM    [place]

## 2016-10-09 NOTE — Patient Instructions (Addendum)
Medication Instructions:  Your physician recommends that you continue on your current medications as directed. Please refer to the Current Medication list given to you today.   Labwork: LABS TODAY: BMET, CBC, TSH  Testing/Procedures: Your physician has requested that you have an exercise tolerance test. For further information please visit [email or web address]. Please also follow instruction sheet, as given.  Follow-Up: Your physician wants you to follow-up in: 1 year with Dr. Radford Pax. You will receive a reminder letter in the mail two months in advance. If you don't receive a letter, please call our office to schedule the follow-up appointment.   Any Other Special Instructions Will Be Listed Below (If Applicable).     If you need a refill on your cardiac medications before your next appointment, please call your pharmacy.

## 2016-11-01 ENCOUNTER — Other Ambulatory Visit: Payer: Self-pay | Admitting: Cardiology

## 2016-11-01 DIAGNOSIS — R002 Palpitations: Secondary | ICD-10-CM

## 2016-11-01 MED FILL — ATENOLOL 25 MG TABLET: 25 | 90 days supply | Qty: 90 | Fill #0

## 2016-11-10 MED FILL — PANTOPRAZOLE SOD DR 40 MG T: 40 | 90 days supply | Qty: 90 | Fill #1

## 2016-12-10 ENCOUNTER — Ambulatory Visit: Payer: Self-pay

## 2016-12-10 ENCOUNTER — Other Ambulatory Visit: Payer: Self-pay

## 2016-12-10 ENCOUNTER — Encounter: Payer: Self-pay | Admitting: Podiatry

## 2016-12-10 ENCOUNTER — Ambulatory Visit (INDEPENDENT_AMBULATORY_CARE_PROVIDER_SITE_OTHER): Payer: 59

## 2016-12-10 ENCOUNTER — Ambulatory Visit: Payer: 59 | Admitting: Podiatry

## 2016-12-10 ENCOUNTER — Telehealth: Payer: Self-pay | Admitting: Podiatry

## 2016-12-10 VITALS — BP 117/73 | HR 68 | Ht 64.0 in | Wt 150.0 lb

## 2016-12-10 DIAGNOSIS — M722 Plantar fascial fibromatosis: Secondary | ICD-10-CM

## 2016-12-10 MED ORDER — METHYLPREDNISOLONE 4 MG PO TBPK: ORAL_TABLET | ORAL | 0 refills | Status: DC

## 2016-12-10 MED ORDER — MELOXICAM 15 MG PO TABS: 15.0000 mg | ORAL_TABLET | Freq: Every day | ORAL | 1 refills | Status: DC

## 2016-12-10 MED FILL — MELOXICAM 15 MG TABLET: 15 | 30 days supply | Qty: 30 | Fill #0

## 2016-12-10 MED FILL — METHYLPREDNISOLONE 4 MG TAB: 4 | 6 days supply | Qty: 21 | Fill #0

## 2016-12-10 NOTE — Telephone Encounter (Signed)
I had an appointment with Dr. Amalia Hailey at 9:30 am this morning. He said he was going to send a 6 day prednisone dose pack as well as an Rx of Meloxicam to the [facility]. They do not have the prescriptions so I was calling to check on that and make sure they get sent in. Thank you very much. Bye.

## 2016-12-10 NOTE — Telephone Encounter (Signed)
Called pt, LMOM informing her I sent in the 2 RX's to [facility].

## 2016-12-10 NOTE — Progress Notes (Signed)
Chief Complaint  Patient presents with  . Plantar Fasciitis    Left much worse than right - heel pain, radiates into arch

## 2016-12-10 NOTE — Patient Instructions (Signed)

## 2016-12-12 NOTE — Progress Notes (Signed)
   Subjective: 51 year old female presents today for pain and tenderness in the left heel that has been present for the past several months. She reports associated radiating pain to the arch of the foot. Patient states that it hurts in the mornings with the first steps out of bed. She has been doing stretches with no significant relief. She reports h/o right heel pain in the past but none today. Patient presents today for further treatment and evaluation.   Past Medical History:  Diagnosis Date  . Anemia   . Dysmenorrhea   . Esophageal reflux   . Essential hypertension, benign   . Genital herpes   . GERD (gastroesophageal reflux disease)   . H/O Clostridium difficile infection 1993   from clindamycin  . History of endometriosis   . History of migraine headaches   . Menorrhagia   . Palpitations   . Renal calculus   . Vitamin D deficiency      Objective: Physical Exam General: The patient is alert and oriented x3 in no acute distress.  Dermatology: Skin is warm, dry and supple bilateral lower extremities. Negative for open lesions or macerations bilateral.   Vascular: Dorsalis Pedis and Posterior Tibial pulses palpable bilateral.  Capillary fill time is immediate to all digits.  Neurological: Epicritic and protective threshold intact bilateral.   Musculoskeletal: Tenderness to palpation at the medial calcaneal tubercale and through the insertion of the plantar fascia of the left foot. All other joints range of motion within normal limits bilateral. Strength 5/5 in all groups bilateral.   Radiographic exam:   Normal osseous mineralization. Joint spaces preserved. No fracture/dislocation/boney destruction. Calcaneal spur present with mild thickening of plantar fascia left. No other soft tissue abnormalities or radiopaque foreign bodies.   Assessment: 1. Plantar fasciitis left foot  Plan of Care:  1. Patient evaluated. Xrays reviewed.   2. Patient declined injection stating  she had a bad reaction in the past (headaches, etc.). 3. Prescription for Medrol Dose Pak provided to patient. 4.  Prescription for Mobic provided to patient. 5.  Appointment with Liliane Channel for custom molded orthotics. 6.  Plantar fascia brace dispensed. 7.  Return to clinic in 8 weeks.  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  Son has flat feet.   Edrick Kins, DPM [facility]  Dr. Edrick Kins, DPM    [address], [place] [address]                Office [phone]  Fax [phone]

## 2016-12-20 ENCOUNTER — Ambulatory Visit (INDEPENDENT_AMBULATORY_CARE_PROVIDER_SITE_OTHER): Payer: 59 | Admitting: Orthotics

## 2016-12-20 DIAGNOSIS — M722 Plantar fascial fibromatosis: Secondary | ICD-10-CM

## 2016-12-20 NOTE — Progress Notes (Signed)

## 2017-01-21 ENCOUNTER — Ambulatory Visit (INDEPENDENT_AMBULATORY_CARE_PROVIDER_SITE_OTHER): Payer: 59 | Admitting: Orthotics

## 2017-01-21 DIAGNOSIS — F4322 Adjustment disorder with anxiety: Secondary | ICD-10-CM | POA: Diagnosis not present

## 2017-01-21 DIAGNOSIS — M722 Plantar fascial fibromatosis: Secondary | ICD-10-CM

## 2017-01-21 NOTE — Progress Notes (Signed)
Patient came in today to pick up custom made foot orthotics.  The goals were accomplished and the patient reported no dissatisfaction with said orthotics.  Patient was advised of breakin period and how to report any issues. 

## 2017-01-29 DIAGNOSIS — F4322 Adjustment disorder with anxiety: Secondary | ICD-10-CM | POA: Diagnosis not present

## 2017-02-04 ENCOUNTER — Ambulatory Visit: Payer: 59 | Admitting: Podiatry

## 2017-02-04 DIAGNOSIS — F4322 Adjustment disorder with anxiety: Secondary | ICD-10-CM | POA: Diagnosis not present

## 2017-02-04 MED FILL — PANTOPRAZOLE SOD DR 40 MG T: 40 | 90 days supply | Qty: 90 | Fill #2

## 2017-02-04 MED FILL — ATENOLOL 25 MG TABLET: 25 | 90 days supply | Qty: 90 | Fill #1

## 2017-02-14 DIAGNOSIS — F4322 Adjustment disorder with anxiety: Secondary | ICD-10-CM | POA: Diagnosis not present

## 2017-02-27 DIAGNOSIS — F4322 Adjustment disorder with anxiety: Secondary | ICD-10-CM | POA: Diagnosis not present

## 2017-03-04 ENCOUNTER — Ambulatory Visit: Payer: 59 | Admitting: Podiatry

## 2017-03-07 DIAGNOSIS — F4322 Adjustment disorder with anxiety: Secondary | ICD-10-CM | POA: Diagnosis not present

## 2017-03-18 DIAGNOSIS — F4322 Adjustment disorder with anxiety: Secondary | ICD-10-CM | POA: Diagnosis not present

## 2017-03-19 DIAGNOSIS — N951 Menopausal and female climacteric states: Secondary | ICD-10-CM | POA: Diagnosis not present

## 2017-03-19 DIAGNOSIS — E559 Vitamin D deficiency, unspecified: Secondary | ICD-10-CM | POA: Diagnosis not present

## 2017-03-19 DIAGNOSIS — Z0000 Encounter for general adult medical examination without abnormal findings: Secondary | ICD-10-CM | POA: Diagnosis not present

## 2017-03-19 DIAGNOSIS — R739 Hyperglycemia, unspecified: Secondary | ICD-10-CM | POA: Diagnosis not present

## 2017-03-19 DIAGNOSIS — Z1159 Encounter for screening for other viral diseases: Secondary | ICD-10-CM | POA: Diagnosis not present

## 2017-03-19 DIAGNOSIS — K219 Gastro-esophageal reflux disease without esophagitis: Secondary | ICD-10-CM | POA: Diagnosis not present

## 2017-03-22 ENCOUNTER — Encounter: Payer: Self-pay | Admitting: *Deleted

## 2017-03-25 ENCOUNTER — Telehealth: Payer: Self-pay | Admitting: *Deleted

## 2017-03-25 NOTE — Telephone Encounter (Signed)
-----   Message from Guss Bunde, MD sent at 03/24/2017  8:58 AM EDT ----- [facility] is menopausal range.  Can remove IUD.

## 2017-03-25 NOTE — Telephone Encounter (Signed)
Pt notified of elevated FSH that shows menopause.  Per Dr Gala Romney she can get her Mirena out.  Pt states that she will wait until summer when she has more flexibility.  She will call for an appt.

## 2017-03-28 DIAGNOSIS — F4322 Adjustment disorder with anxiety: Secondary | ICD-10-CM | POA: Diagnosis not present

## 2017-04-01 DIAGNOSIS — F4322 Adjustment disorder with anxiety: Secondary | ICD-10-CM | POA: Diagnosis not present

## 2017-04-11 DIAGNOSIS — F4322 Adjustment disorder with anxiety: Secondary | ICD-10-CM | POA: Diagnosis not present

## 2017-04-15 ENCOUNTER — Other Ambulatory Visit: Payer: Self-pay | Admitting: Obstetrics & Gynecology

## 2017-04-15 DIAGNOSIS — F4322 Adjustment disorder with anxiety: Secondary | ICD-10-CM | POA: Diagnosis not present

## 2017-04-15 DIAGNOSIS — N6320 Unspecified lump in the left breast, unspecified quadrant: Secondary | ICD-10-CM

## 2017-04-19 ENCOUNTER — Ambulatory Visit
Admission: RE | Admit: 2017-04-19 | Discharge: 2017-04-19 | Disposition: A | Discharge status: Home / Self Care | Payer: 59 | Source: Ambulatory Visit | Attending: Obstetrics & Gynecology | Admitting: Obstetrics & Gynecology

## 2017-04-19 ENCOUNTER — Ambulatory Visit: Payer: Self-pay

## 2017-04-19 DIAGNOSIS — N6320 Unspecified lump in the left breast, unspecified quadrant: Secondary | ICD-10-CM

## 2017-04-19 DIAGNOSIS — R928 Other abnormal and inconclusive findings on diagnostic imaging of breast: Secondary | ICD-10-CM | POA: Diagnosis not present

## 2017-05-07 MED FILL — ATENOLOL 25 MG TABLET: 25 | 90 days supply | Qty: 90 | Fill #2

## 2017-05-07 MED FILL — IBUPROFEN 800 MG TAB: 800 | 20 days supply | Qty: 60 | Fill #1

## 2017-05-07 MED FILL — PANTOPRAZOLE SOD DR 40 MG T: 40 | 90 days supply | Qty: 90 | Fill #3

## 2017-05-09 ENCOUNTER — Telehealth: Payer: 59 | Admitting: Physician Assistant

## 2017-05-09 DIAGNOSIS — B9689 Other specified bacterial agents as the cause of diseases classified elsewhere: Secondary | ICD-10-CM | POA: Diagnosis not present

## 2017-05-09 DIAGNOSIS — J019 Acute sinusitis, unspecified: Secondary | ICD-10-CM | POA: Diagnosis not present

## 2017-05-09 DIAGNOSIS — F4322 Adjustment disorder with anxiety: Secondary | ICD-10-CM | POA: Diagnosis not present

## 2017-05-09 MED ORDER — DOXYCYCLINE HYCLATE 100 MG PO CAPS: 100.0000 mg | ORAL_CAPSULE | Freq: Two times a day (BID) | ORAL | 0 refills | Status: DC

## 2017-05-09 MED FILL — DOXYCYCLINE HYCLATE 100 MG: 100 | 10 days supply | Qty: 20 | Fill #0

## 2017-05-09 NOTE — Progress Notes (Signed)

## 2017-05-14 DIAGNOSIS — F4322 Adjustment disorder with anxiety: Secondary | ICD-10-CM | POA: Diagnosis not present

## 2017-05-20 DIAGNOSIS — F4322 Adjustment disorder with anxiety: Secondary | ICD-10-CM | POA: Diagnosis not present

## 2017-05-23 DIAGNOSIS — H5213 Myopia, bilateral: Secondary | ICD-10-CM | POA: Diagnosis not present

## 2017-06-11 DIAGNOSIS — F4322 Adjustment disorder with anxiety: Secondary | ICD-10-CM | POA: Diagnosis not present

## 2017-06-17 DIAGNOSIS — F4322 Adjustment disorder with anxiety: Secondary | ICD-10-CM | POA: Diagnosis not present

## 2017-06-19 DIAGNOSIS — F4322 Adjustment disorder with anxiety: Secondary | ICD-10-CM | POA: Diagnosis not present

## 2017-07-03 DIAGNOSIS — H01004 Unspecified blepharitis left upper eyelid: Secondary | ICD-10-CM | POA: Diagnosis not present

## 2017-07-03 MED FILL — TOBRAMYCIN-DEXAMETH OPTH SU: 0.3-0.1 | 5 days supply | Qty: 5 | Fill #0

## 2017-07-31 IMAGING — US US PELVIS COMPLETE
1 series · 15 of 25 positions shown · non-contrast
Comparison: None

CLINICAL DATA: Pelvic pain for 5 days.  IUD.



[Series 1: us pelvis complete · 68 acquisitions, 15 frames shown]
[im 1/68]
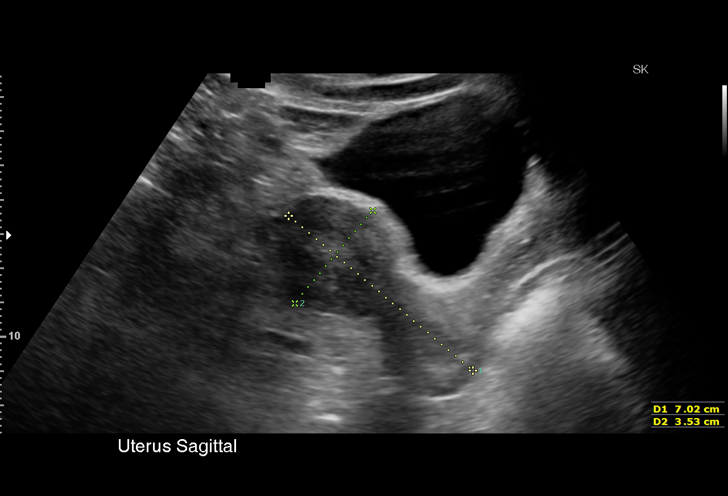
[im 6/68]
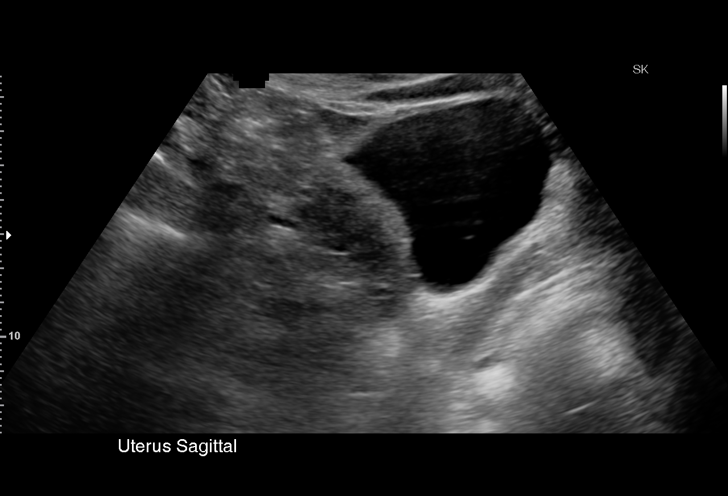
[im 12/68]
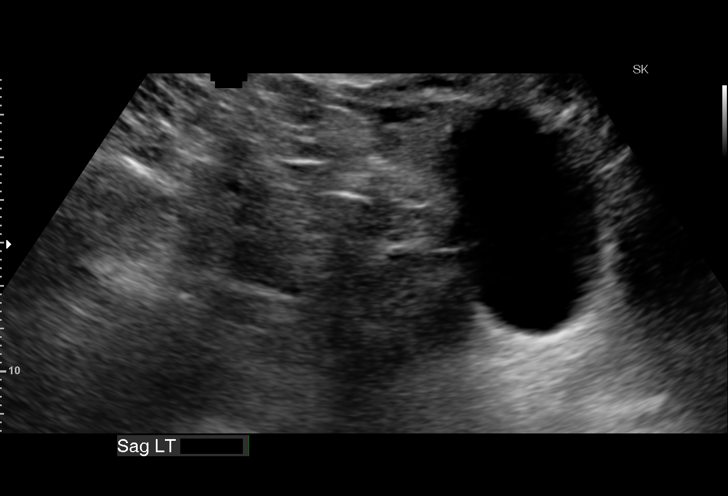
[im 14/68]
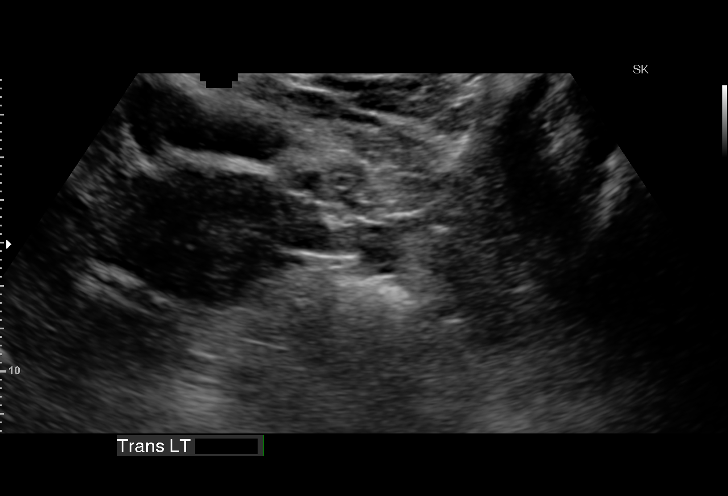
[im 20/68]
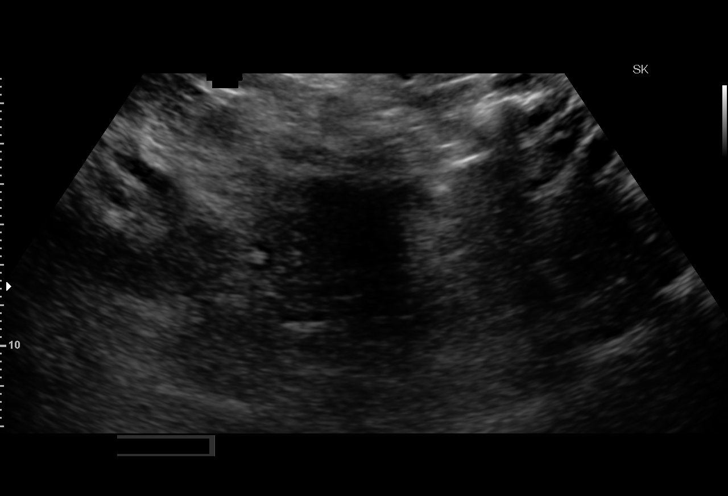
[im 26/68]
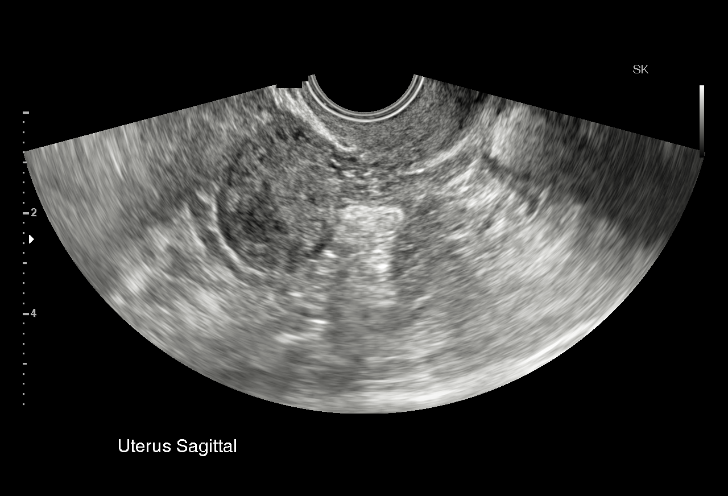
[im 28/68]
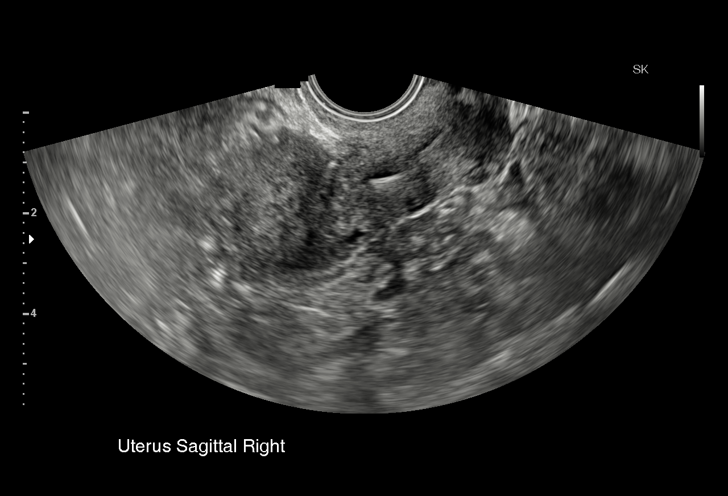
[im 34/68]
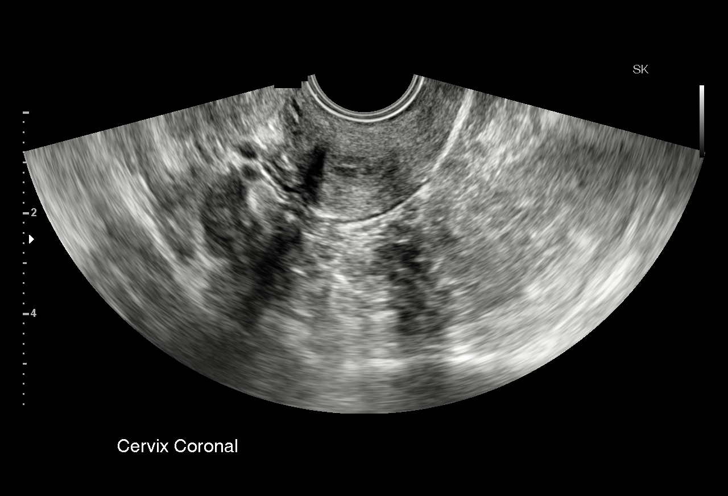
[im 40/68]
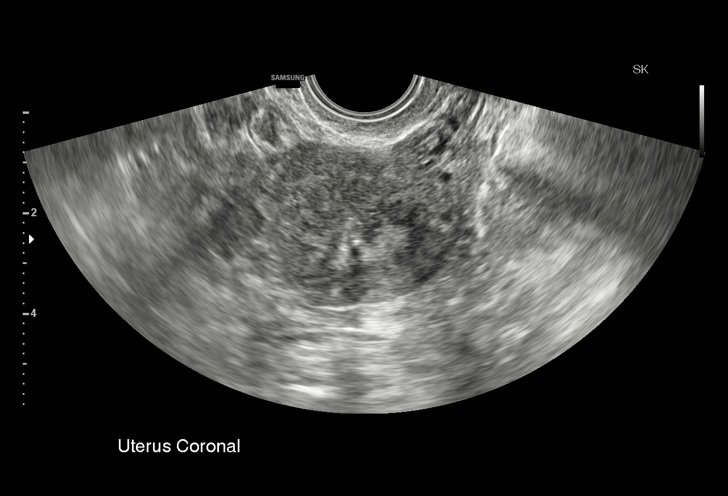
[im 42/68]
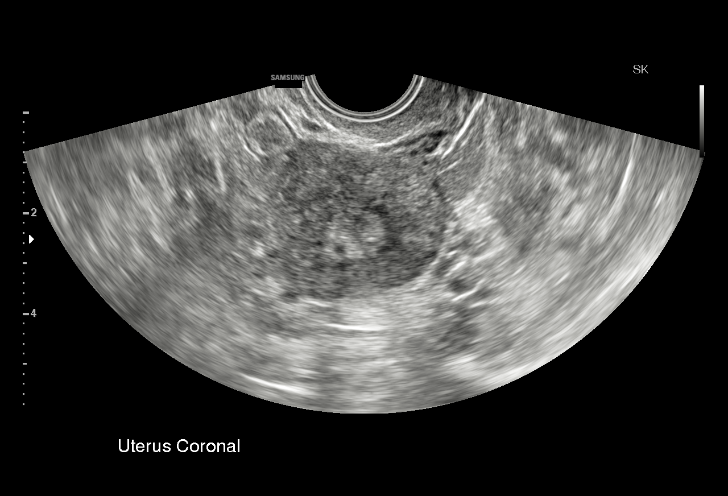
[im 48/68]
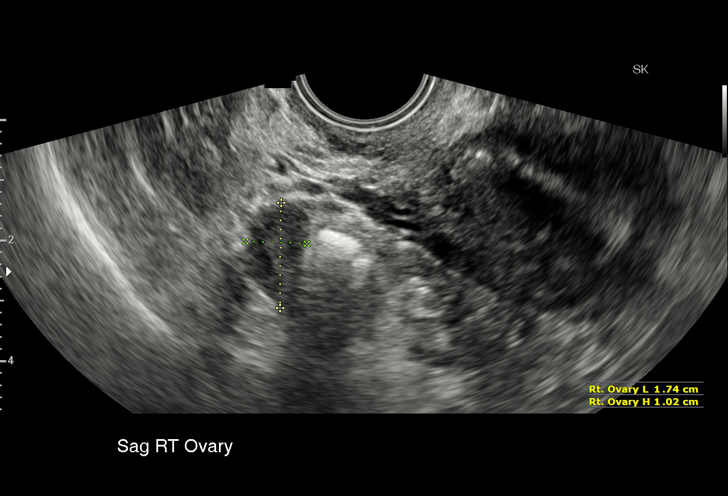
[im 54/68]
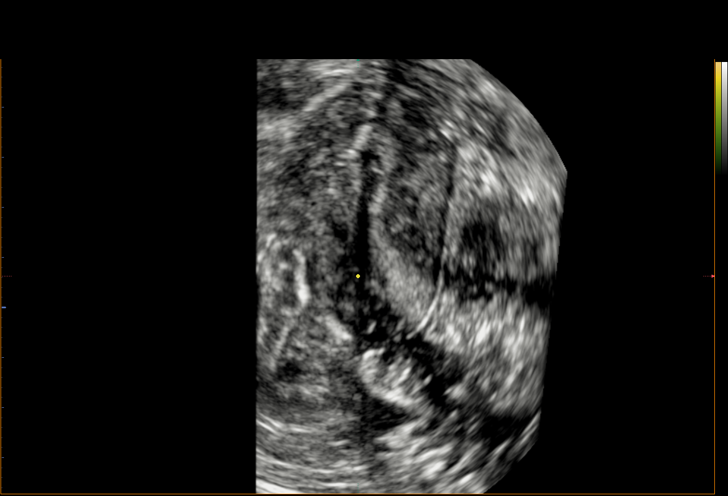
[im 56/68]
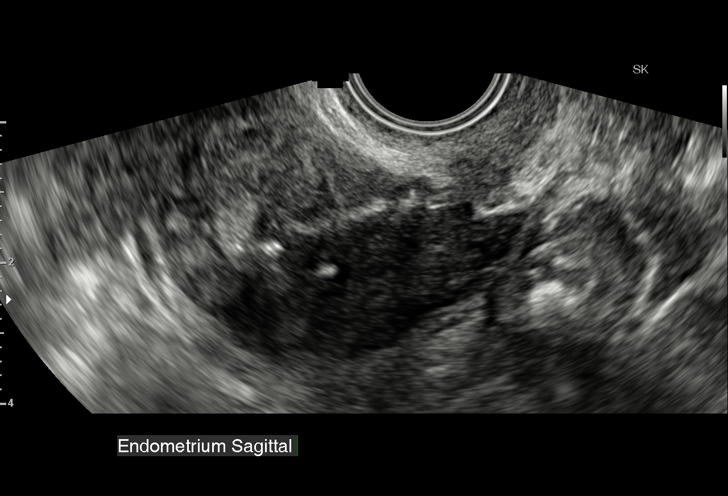
[im 62/68]
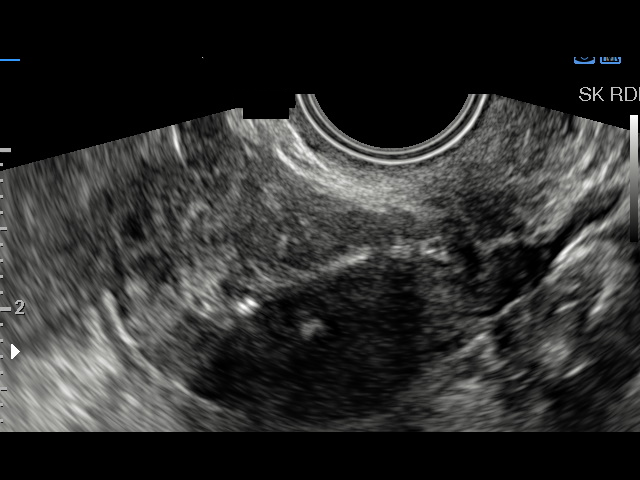
[im 68/68]
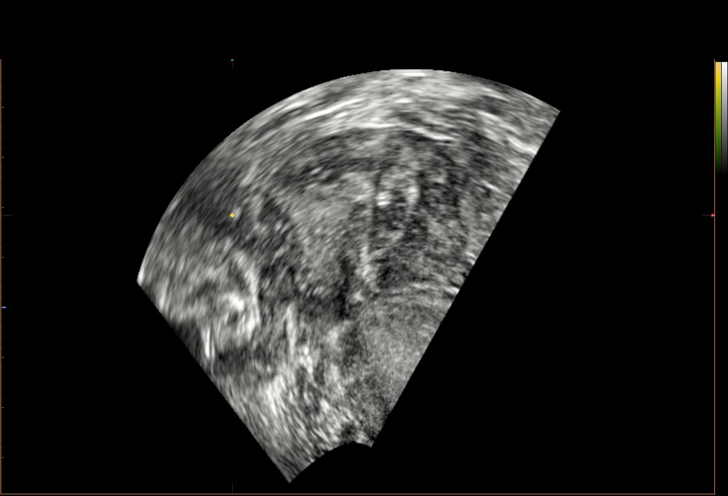

[15 of 25 positions shown; findings below may reference images not displayed]

FINDINGS: Uterus

Measurements: 6.6 x 3.3 x 3.5 cm. No fibroids or other mass
visualized.

Endometrium

Thickness: 5.7 mm. Intrauterine device is difficult to visualize but
does not appear to be normally positioned in the endometrium. The
orientation of the IUD is also difficult to determine. Portions of
the device appear to be near or within the C-section scar. Echogenic
focus is identified within the posterior myometrium. It is possible
that 1 or both arms of the intrauterine device has a myometrial
location.

Right ovary

Measurements: 1.7 x 1.0 x 0.9 cm. Normal appearance/no adnexal mass.

Left ovary

Measurements: 1.7 x 1.1 x 0.7 cm. Normal appearance/no adnexal mass.

Other findings

No free fluid.
IMPRESSION: 1. Intrauterine device is very difficult to visualize. A portion of
the device appears to be near or within the C-section scar.
2. Presence of 1 or both arms of the IUD may be within the
myometrium but this is difficult to confirm.

## 2017-08-05 MED FILL — ATENOLOL 25 MG TABLET: 25 | 90 days supply | Qty: 90 | Fill #3

## 2017-08-05 MED FILL — PANTOPRAZOLE SOD DR 40 MG T: 40 | 90 days supply | Qty: 90 | Fill #0

## 2017-10-17 DIAGNOSIS — E785 Hyperlipidemia, unspecified: Secondary | ICD-10-CM | POA: Insufficient documentation

## 2017-11-15 ENCOUNTER — Other Ambulatory Visit: Payer: Self-pay | Admitting: Cardiology

## 2017-11-15 DIAGNOSIS — R002 Palpitations: Secondary | ICD-10-CM

## 2017-11-15 MED ORDER — ATENOLOL 25 MG PO TABS: ORAL_TABLET | ORAL | 0 refills | Status: DC

## 2018-01-06 ENCOUNTER — Other Ambulatory Visit: Payer: Self-pay | Admitting: Cardiology

## 2018-01-06 DIAGNOSIS — R002 Palpitations: Secondary | ICD-10-CM

## 2018-01-21 MED FILL — FLUTICASONE PROP 50 MCG SPR: 50 | 30 days supply | Qty: 16 | Fill #0

## 2018-01-21 MED FILL — predniSONE 20 MG TABS: 20 | 6 days supply | Qty: 9 | Fill #0

## 2018-01-21 MED FILL — AZITHROMYCIN 250 MG TABS: 250 | 5 days supply | Qty: 6 | Fill #0

## 2018-02-03 ENCOUNTER — Other Ambulatory Visit: Payer: Self-pay | Admitting: *Deleted

## 2018-02-03 DIAGNOSIS — B009 Herpesviral infection, unspecified: Secondary | ICD-10-CM

## 2018-02-03 DIAGNOSIS — Z8669 Personal history of other diseases of the nervous system and sense organs: Secondary | ICD-10-CM

## 2018-02-03 MED ORDER — VALACYCLOVIR HCL 500 MG PO TABS: 500.0000 mg | ORAL_TABLET | Freq: Two times a day (BID) | ORAL | 0 refills | Status: DC

## 2018-02-03 MED ORDER — IBUPROFEN 800 MG PO TABS: 800.0000 mg | ORAL_TABLET | Freq: Three times a day (TID) | ORAL | 0 refills | Status: AC | PRN

## 2018-02-03 NOTE — Telephone Encounter (Signed)
Pt. left message that she would like RF's on Valtrex and Motrin 800 mg.  1 RF each given and pt is aware that she is overdue for her annual

## 2018-03-07 ENCOUNTER — Ambulatory Visit: Payer: PRIVATE HEALTH INSURANCE | Admitting: Cardiology

## 2018-03-07 VITALS — BP 114/76 | HR 53 | Ht 64.0 in | Wt 164.0 lb

## 2018-03-07 DIAGNOSIS — R002 Palpitations: Secondary | ICD-10-CM

## 2018-03-07 DIAGNOSIS — R0789 Other chest pain: Secondary | ICD-10-CM

## 2018-03-07 DIAGNOSIS — I10 Essential (primary) hypertension: Secondary | ICD-10-CM

## 2018-03-07 MED ORDER — ATENOLOL 25 MG PO TABS: 25.0000 mg | ORAL_TABLET | Freq: Every day | ORAL | 3 refills | Status: DC

## 2018-03-07 NOTE — Progress Notes (Signed)
Cardiology Office Note:    Date:  03/07/2018   ID:  Sherry Wallace, Sherry Wallace [birth year], MRN [id number]  PCP:  Cari Caraway, MD  Cardiologist:  No primary care provider on file.    Referring MD: Cari Caraway, MD   Chief Complaint  Patient presents with  . Hypertension    History of Present Illness:    Sherry Wallace is a 53 y.o. female with a hx of palpitations related to caffeine intake and HTN.  She is here today for followup and is doing well.  She tells me that once in a great while she will have a twinge of chest pain across her chest and up around her throat.  Is not really exertional in nature with no associated symptoms.  She denies any SOB, DOE, PND, orthopnea, LE edema, dizziness, palpitations or syncope. She is compliant with her meds and is tolerating meds with no SE. unfortunately her insurance plan has changed and she is no longer going to be able to see [facility] and is required to see a [facility] physician.  Past Medical History:  Diagnosis Date  . Anemia   . Dysmenorrhea   . Esophageal reflux   . Essential hypertension, benign   . Genital herpes   . GERD (gastroesophageal reflux disease)   . H/O Clostridium difficile infection 1993   from clindamycin  . History of endometriosis   . History of migraine headaches   . Menorrhagia   . Palpitations   . Renal calculus   . Vitamin D deficiency     Past Surgical History:  Procedure Laterality Date  . APPENDECTOMY    . CESAREAN SECTION     X2  . COSMETIC SURGERY     rhinoplasty  . KNEE ARTHROPLASTY    . TONSILLECTOMY AND ADENOIDECTOMY      Current Medications: Current Meds  Medication Sig  . atenolol (TENORMIN) 25 MG tablet Take 1 tablet (25 mg total) by mouth daily.  Marland Kitchen ibuprofen (ADVIL,MOTRIN) 800 MG tablet Take 1 tablet (800 mg total) by mouth every 8 (eight) hours as needed.  . pantoprazole (PROTONIX) 40 MG tablet Take 40 mg by mouth daily.  . valACYclovir (VALTREX) 500 MG tablet Take 1 tablet  (500 mg total) by mouth 2 (two) times daily. (Patient taking differently: Take 500 mg by mouth as needed. )  . [DISCONTINUED] atenolol (TENORMIN) 25 MG tablet TAKE 1 TABLET BY MOUTH ONCE DAILY. Please keep upcoming appt for future refills. Thank you     Allergies:   Fentanyl; Penicillins; and Sulfa antibiotics   Social History   Socioeconomic History  . Marital status: Married    Spouse name: Not on file  . Number of children: Not on file  . Years of education: Not on file  . Highest education level: Not on file  Occupational History  . Not on file  Social Needs  . Financial resource strain: Not on file  . Food insecurity:    Worry: Not on file    Inability: Not on file  . Transportation needs:    Medical: Not on file    Non-medical: Not on file  Tobacco Use  . Smoking status: Never Smoker  . Smokeless tobacco: Never Used  Substance and Sexual Activity  . Alcohol use: Yes    Comment: rarely  . Drug use: No  . Sexual activity: Yes    Birth control/protection: Surgical, I.U.D.  Lifestyle  . Physical activity:    Days per week:  Not on file    Minutes per session: Not on file  . Stress: Not on file  Relationships  . Social connections:    Talks on phone: Not on file    Gets together: Not on file    Attends religious service: Not on file    Active member of club or organization: Not on file    Attends meetings of clubs or organizations: Not on file    Relationship status: Not on file  Other Topics Concern  . Not on file  Social History Narrative  . Not on file     Family History: The patient's family history includes Cancer in her maternal grandmother; Cirrhosis in her mother; Colon polyps in her mother; Heart disease in her maternal grandfather and paternal grandmother; Liver disease in her mother.  ROS:   Please see the history of present illness.    ROS  All other systems reviewed and negative.   EKGs/Labs/Other Studies Reviewed:    The following studies  were reviewed today: none  EKG:  EKG is  ordered today.  The ekg ordered today demonstrates sinus bradycardia at 53bpm with no St changes  Recent Labs: No results found for requested labs within last 8760 hours.   Recent Lipid Panel    Component Value Date/Time   CHOL 239 (H) 11/07/2011 1511   TRIG 80 11/07/2011 1511   HDL 63 11/07/2011 1511   CHOLHDL 3.8 11/07/2011 1511   VLDL 16 11/07/2011 1511   LDLCALC 160 (H) 11/07/2011 1511    Physical Exam:    VS:  BP 114/76   Pulse (!) 53   Ht 5\' 4"  (1.626 m)   Wt 164 lb (74.4 kg)   BMI 28.15 kg/m     Wt Readings from Last 3 Encounters:  03/07/18 164 lb (74.4 kg)  12/10/16 150 lb (68 kg)  10/09/16 162 lb 9.6 oz (73.8 kg)     GEN:  Well nourished, well developed in no acute distress HEENT: Normal NECK: No JVD; No carotid bruits LYMPHATICS: No lymphadenopathy CARDIAC: RRR, no murmurs, rubs, gallops RESPIRATORY:  Clear to auscultation without rales, wheezing or rhonchi  ABDOMEN: Soft, non-tender, non-distended MUSCULOSKELETAL:  No edema; No deformity  SKIN: Warm and dry NEUROLOGIC:  Alert and oriented x 3 PSYCHIATRIC:  Normal affect   ASSESSMENT:    1. Essential hypertension, benign   2. Palpitations   3. Chest pain, atypical    PLAN:    In order of problems listed above:  1.  HTN - BP is controlled on exam today.  She will continue on Atenlol 25mg  daily.  2.  Palpitations - this has been related to caffeine intake in the past.  This is well controlled on BB.  3.  Chest pain - is atypical and very sporadic.  Her EKG is nonischemic.  I recommended that she consider getting an exercise treadmill test.  She tells me that we cannot order this because her insurance would not cover it.  Her insurance has changed now and it would not cover [place] [facility] care at [facility] health any further and she is going to have to get a new physician in [place] with [facility].  I have encouraged her to go ahead and do this given that she  has had some chest pain that should be further evaluated.   Medication Adjustments/Labs and Tests Ordered: Current medicines are reviewed at length with the patient today.  Concerns regarding medicines are outlined above.  Orders Placed This  Encounter  Procedures  . EKG 12-Lead   Meds ordered this encounter  Medications  . atenolol (TENORMIN) 25 MG tablet    Sig: Take 1 tablet (25 mg total) by mouth daily.    Dispense:  90 tablet    Refill:  3    Signed, Fransico Him, MD  03/07/2018 8:34 AM    [facility]

## 2018-03-07 NOTE — Patient Instructions (Signed)
Medication Instructions:  Your physician recommends that you continue on your current medications as directed. Please refer to the Current Medication list given to you today.  If you need a refill on your cardiac medications before your next appointment, please call your pharmacy.   Lab work: None If you have labs (blood work) drawn today and your tests are completely normal, you will receive your results only by: Marland Kitchen MyChart Message (if you have MyChart) OR . A paper copy in the mail If you have any lab test that is abnormal or we need to change your treatment, we will call you to review the results.  Testing/Procedures: None  Follow-Up: As needed

## 2018-08-14 ENCOUNTER — Telehealth: Payer: Self-pay | Admitting: *Deleted

## 2018-08-14 DIAGNOSIS — B009 Herpesviral infection, unspecified: Secondary | ICD-10-CM

## 2018-08-14 MED ORDER — VALACYCLOVIR HCL 500 MG PO TABS: 500.0000 mg | ORAL_TABLET | Freq: Two times a day (BID) | ORAL | 0 refills | Status: AC

## 2018-08-14 NOTE — Telephone Encounter (Signed)
-----   Message from Tarry Kos sent at 08/14/2018  4:05 PM EDT ----- Contact: [phone] Patient needs Valtrex refill, send to  new pharmacy [facility]

## 2018-12-30 ENCOUNTER — Encounter: Payer: Self-pay | Admitting: *Deleted

## 2019-05-20 ENCOUNTER — Other Ambulatory Visit: Payer: Self-pay | Admitting: Cardiology

## 2019-05-20 DIAGNOSIS — R002 Palpitations: Secondary | ICD-10-CM

## 2019-05-28 ENCOUNTER — Ambulatory Visit: Payer: PRIVATE HEALTH INSURANCE | Admitting: Cardiology

## 2021-07-17 ENCOUNTER — Encounter: Payer: PRIVATE HEALTH INSURANCE | Admitting: Obstetrics & Gynecology

## 2021-08-08 NOTE — Progress Notes (Signed)
Last Mammogram: 04/20/19- negative (care everywhere) Last Pap Smear:  09/29/15 Last Colon Screening: 2015 WNL Seat Belts:  Yes Sun Screen:   Almost Always Dental Check Up:  Always Brush & Floss:  ALways

## 2021-08-14 ENCOUNTER — Encounter: Payer: Self-pay | Admitting: Obstetrics & Gynecology

## 2021-08-14 ENCOUNTER — Ambulatory Visit (INDEPENDENT_AMBULATORY_CARE_PROVIDER_SITE_OTHER): Payer: PRIVATE HEALTH INSURANCE | Admitting: Obstetrics & Gynecology

## 2021-08-14 ENCOUNTER — Other Ambulatory Visit (HOSPITAL_COMMUNITY)
Admission: RE | Admit: 2021-08-14 | Discharge: 2021-08-14 | Disposition: A | Discharge status: Home / Self Care | Payer: PRIVATE HEALTH INSURANCE | Source: Ambulatory Visit | Attending: Obstetrics & Gynecology | Admitting: Obstetrics & Gynecology

## 2021-08-14 VITALS — BP 121/82 | HR 68 | Ht 64.0 in | Wt 170.0 lb

## 2021-08-14 DIAGNOSIS — Z01419 Encounter for gynecological examination (general) (routine) without abnormal findings: Secondary | ICD-10-CM

## 2021-08-14 DIAGNOSIS — Z30432 Encounter for removal of intrauterine contraceptive device: Secondary | ICD-10-CM

## 2021-08-14 DIAGNOSIS — N905 Atrophy of vulva: Secondary | ICD-10-CM | POA: Diagnosis not present

## 2021-08-14 DIAGNOSIS — N898 Other specified noninflammatory disorders of vagina: Secondary | ICD-10-CM | POA: Diagnosis not present

## 2021-08-14 NOTE — Progress Notes (Signed)
Subjective:     Sherry Wallace is a 56 y.o. female here for a routine exam.  Current complaints: believes she is in menopause and would like IUD out.   Gynecologic History No LMP recorded. (Menstrual status: IUD). Contraception: post menopausal status Last Mammogram: 04/20/19- negative (care everywhere) Last Pap Smear:  09/29/15 Last Colon Screening: 2015 WNL Seat Belts:  Yes Sun Screen:   Almost Always Dental Check Up:  Always Brush & Floss:  ALways  Obstetric History OB History  Gravida Para Term Preterm AB Living  '3 2     1 2  '$ SAB IAB Ectopic Multiple Live Births    1          # Outcome Date GA Lbr Len/2nd Weight Sex Delivery Anes PTL Lv  3 Para           2 IAB           1 Para              The following portions of the patient's history were reviewed and updated as appropriate: allergies, current medications, past family history, past medical history, past social history, past surgical history, and problem list.  Review of Systems Pertinent items noted in HPI and remainder of comprehensive ROS otherwise negative.    Objective:     Vitals:   08/14/21 1001  BP: 121/82  Pulse: 68  Weight: 170 lb (77.1 kg)  Height: '5\' 4"'$  (1.626 m)   Vitals:  WNL General appearance: alert, cooperative and no distress  HEENT: Normocephalic, without obvious abnormality, atraumatic Eyes: negative Throat: lips, mucosa, and tongue normal; teeth and gums normal  Respiratory: Clear to auscultation bilaterally  CV: Regular rate and rhythm  Breasts:  Normal appearance, no masses or tenderness, no nipple retraction or dimpling  GI: Soft, non-tender; bowel sounds normal; no masses,  no organomegaly  GU: External Genitalia:  Tanner V, change in labia minor; agglutiation to labia majora, white, thin--?LS Urethra:  No prolapse   Vagina: Pink, normal rugae, atrophic and friable, speculum exam tender  Cervix: No CMT, no lesion, IUD strings visible  Uterus:  Normal size and contour, non tender   Adnexa: Normal, no masses, non tender  Musculoskeletal: No edema, redness or tenderness in the calves or thighs  Skin: No lesions or rash  Lymphatic: Axillary adenopathy: none     Psychiatric: Normal mood and behavior         Assessment:    Healthy female exam.  Changes in labia minora clinically c/w LS Atrohpic vaginitis   Plan:    Pap with cotesting Aptima for vaginal discharge RTC 3 weeks for vulvar biopsy IUd removed Mammogram ordered (will do at [facility])  IUD Removal Patient identified, informed consent performed. Discussed risks of irregular bleeding, cramping, infection, malpositioning or misplacement of the IUD outside the uterus which may require further procedures. Time out was performed. Speculum placed in the vagina. Cervix visualized. The strings of the IUD were grasped and pulled using ring forceps. The IUD was successfully removed in its entirety.

## 2021-08-15 LAB — CERVICOVAGINAL ANCILLARY ONLY
Bacterial Vaginitis (gardnerella): POSITIVE — AB
Candida Glabrata: NEGATIVE
Candida Vaginitis: NEGATIVE
Comment: NEGATIVE
Comment: NEGATIVE
Comment: NEGATIVE

## 2021-08-16 ENCOUNTER — Encounter: Payer: Self-pay | Admitting: Obstetrics & Gynecology

## 2021-08-16 LAB — CYTOLOGY - PAP
Comment: NEGATIVE
Diagnosis: NEGATIVE
High risk HPV: NEGATIVE

## 2021-08-21 ENCOUNTER — Other Ambulatory Visit: Payer: Self-pay | Admitting: Obstetrics & Gynecology

## 2021-08-21 MED ORDER — ESTRADIOL 10 MCG VA TABS: ORAL_TABLET | VAGINAL | 12 refills | Status: DC

## 2021-08-21 MED ORDER — METRONIDAZOLE 500 MG PO TABS: 500.0000 mg | ORAL_TABLET | Freq: Two times a day (BID) | ORAL | 0 refills | Status: DC

## 2021-09-04 ENCOUNTER — Other Ambulatory Visit (HOSPITAL_COMMUNITY)
Admission: RE | Admit: 2021-09-04 | Discharge: 2021-09-04 | Disposition: A | Discharge status: Home / Self Care | Payer: PRIVATE HEALTH INSURANCE | Source: Ambulatory Visit | Attending: Obstetrics & Gynecology | Admitting: Obstetrics & Gynecology

## 2021-09-04 ENCOUNTER — Ambulatory Visit: Payer: PRIVATE HEALTH INSURANCE | Admitting: Obstetrics & Gynecology

## 2021-09-04 ENCOUNTER — Encounter: Payer: Self-pay | Admitting: Obstetrics & Gynecology

## 2021-09-04 VITALS — BP 122/82 | HR 80 | Resp 16 | Ht 64.0 in | Wt 168.0 lb

## 2021-09-04 DIAGNOSIS — L720 Epidermal cyst: Secondary | ICD-10-CM

## 2021-09-04 DIAGNOSIS — L900 Lichen sclerosus et atrophicus: Secondary | ICD-10-CM | POA: Diagnosis present

## 2021-09-04 DIAGNOSIS — L738 Other specified follicular disorders: Secondary | ICD-10-CM | POA: Diagnosis not present

## 2021-09-04 DIAGNOSIS — N898 Other specified noninflammatory disorders of vagina: Secondary | ICD-10-CM

## 2021-09-04 NOTE — Progress Notes (Signed)
   Subjective:    Patient ID: Sherry Wallace, female    DOB: May 08, 1965, 56 y.o.   MRN: [id number]  HPI  Sherry Wallace is a 56 year old female who presents for follow-up of vaginal discharge and treatment of BV as well as vulvar biopsy for suspected lichen sclerosis.  Patient improved with Flagyl.  She is not having any more vaginal discharge.  Vulvar irritation does continue.  Review of Systems  Constitutional: Negative.   Respiratory: Negative.    Cardiovascular: Negative.   Gastrointestinal: Negative.   Genitourinary: Negative.        Vulvar irritation       Objective:   Vitals:   09/04/21 1332  BP: 122/82  Pulse: 80  Resp: 16  Weight: 168 lb (76.2 kg)  Height: '5\' 4"'$  (1.626 m)   Vitals:  WNL General appearance: alert, cooperative and no distress  HEENT: Normocephalic, without obvious abnormality, atraumatic  Respiratory: Normal effort  CV: Regular rhythm  Breasts:  Normal appearance, no masses or tenderness, no nipple retraction or dimpling  GI: Soft, non-tender; bowel sounds normal; no masses,  no organomegaly  GU: External Genitalia:  Tanner V, thin white, diminished labia minora nd surrounding tissue Urethra:  No prolapse                               Assessment & Plan:  56 year old female with resolved bacterial vaginosis and suspected lichen sclerosus.  Vulvar biopsy today  VULVAR BIOPSY NOTE  The indications for vulvar biopsy (rule out neoplasia, establish lichen sclerosus diagnosis) were reviewed.   Risks of the biopsy including pain, bleeding, infection, inadequate specimen, scarring and need for additional procedures  were discussed. The patient stated understanding and agreed to undergo procedure today. Consent was signed,  time out performed.  The patient's vulva was prepped with Betadine. 1% lidocaine was injected into right labia minora. A 3-mm punch biopsy was done, biopsy tissue was picked up with sterile forceps and sterile scissors were used to  excise the lesion.  Small bleeding was noted and hemostasis was achieved using silver nitrate sticks.  The patient tolerated the procedure well. Post-procedure instructions  (pelvic rest for one week) were given to the patient. The patient is to call with heavy bleeding, fever greater than 100.4, foul smelling vaginal discharge or other concerns.  We will follow-up with patient about results and plan.

## 2021-09-06 LAB — SURGICAL PATHOLOGY

## 2021-09-08 ENCOUNTER — Encounter: Payer: Self-pay | Admitting: Obstetrics & Gynecology

## 2021-09-08 ENCOUNTER — Other Ambulatory Visit: Payer: Self-pay | Admitting: Obstetrics & Gynecology

## 2021-09-08 MED ORDER — CLOBETASOL PROPIONATE 0.05 % EX OINT: TOPICAL_OINTMENT | CUTANEOUS | 1 refills | Status: DC

## 2021-09-08 NOTE — Progress Notes (Signed)
Biopsy being reread by dermatopathologist.  Starting steroids for symptoms.  May require a 2nd biopsy in future.

## 2021-10-03 ENCOUNTER — Encounter: Payer: Self-pay | Admitting: Obstetrics & Gynecology

## 2021-12-04 ENCOUNTER — Encounter: Payer: Self-pay | Admitting: Obstetrics & Gynecology

## 2021-12-04 ENCOUNTER — Ambulatory Visit (INDEPENDENT_AMBULATORY_CARE_PROVIDER_SITE_OTHER): Payer: PRIVATE HEALTH INSURANCE | Admitting: Obstetrics & Gynecology

## 2021-12-04 VITALS — BP 137/85 | HR 56 | Ht 64.0 in | Wt 159.0 lb

## 2021-12-04 DIAGNOSIS — L900 Lichen sclerosus et atrophicus: Secondary | ICD-10-CM

## 2021-12-04 DIAGNOSIS — N952 Postmenopausal atrophic vaginitis: Secondary | ICD-10-CM | POA: Diagnosis not present

## 2021-12-04 MED ORDER — ESTRADIOL 10 MCG VA TABS
ORAL_TABLET | VAGINAL | 12 refills | Status: DC
Start: 2021-12-04 — End: 2023-02-14

## 2021-12-04 MED ORDER — CLOBETASOL PROPIONATE 0.05 % EX OINT
TOPICAL_OINTMENT | CUTANEOUS | 1 refills | Status: DC
Start: 2021-12-04 — End: 2023-08-08

## 2021-12-04 NOTE — Progress Notes (Signed)
   Subjective:    Patient ID: Sherry Wallace, female    DOB: June 17, 1965, 56 y.o.   MRN: [id number]  HPI  Sherry Wallace is a 56 year old female who presents for follow-up for lichen sclerosus.  Patient is burning it is completely gone.  She is unsure whether she is improving physically.  She is still using the clobetasol 5 nights a week.    Review of Systems     Objective:   Physical Exam Vitals reviewed.  Constitutional:      General: She is not in acute distress.    Appearance: She is well-developed.  HENT:     Head: Normocephalic and atraumatic.  Eyes:     Conjunctiva/sclera: Conjunctivae normal.  Cardiovascular:     Rate and Rhythm: Normal rate.  Pulmonary:     Effort: Pulmonary effort is normal.  Skin:    General: Skin is warm and dry.  Neurological:     Mental Status: She is alert and oriented to person, place, and time.  Psychiatric:        Mood and Affect: Mood normal.           Assessment & Plan:  56 yo female much improved with clobesol.    Reviewed biopsy--most likely did biopsy an epidermoid cyst/sebaceous gland.   (was the thickest portion at time of exam).  I lalos believe Sherry Wallace has LS.  Burning has subsided while weaned down to 5x/wk.  Will keep weaning until maintenance of twice a week. Vulvar architecture has improved.  If flares again, will re-biopsy.  Continue vaginal estrogen maintenance as needed.  RT 6 months.

## 2022-09-03 ENCOUNTER — Encounter: Payer: Self-pay | Admitting: Obstetrics & Gynecology

## 2022-09-03 ENCOUNTER — Ambulatory Visit (INDEPENDENT_AMBULATORY_CARE_PROVIDER_SITE_OTHER): Payer: PRIVATE HEALTH INSURANCE | Admitting: Obstetrics & Gynecology

## 2022-09-03 VITALS — BP 130/80 | HR 65 | Ht 64.0 in | Wt 144.0 lb

## 2022-09-03 DIAGNOSIS — L900 Lichen sclerosus et atrophicus: Secondary | ICD-10-CM

## 2022-09-03 DIAGNOSIS — Z01419 Encounter for gynecological examination (general) (routine) without abnormal findings: Secondary | ICD-10-CM | POA: Diagnosis not present

## 2022-09-03 NOTE — Progress Notes (Signed)
Subjective:     Sherry Wallace is a 57 y.o. female here for a routine exam.  Current complaints: none.     Gynecologic History Patient's last menstrual period was 08/15/2012. Contraception: post menopausal status Last Mammogram: 2021- negative Last Pap Smear:  08/14/21- negative Last Colon Screening;  2015 Seat Belts:   yes Sun Screen:   yes Dental Check Up:  yes Brush & Floss:  yes    Obstetric History OB History  Gravida Para Term Preterm AB Living  3 2     1 2   SAB IAB Ectopic Multiple Live Births    1          # Outcome Date GA Lbr Len/2nd Weight Sex Type Anes PTL Lv  3 Para           2 IAB           1 Para              The following portions of the patient's history were reviewed and updated as appropriate: allergies, current medications, past family history, past medical history, past social history, past surgical history, and problem list.  Review of Systems Pertinent items noted in HPI and remainder of comprehensive ROS otherwise negative.    Objective:     Vitals:   09/03/22 0925  BP: 130/80  Pulse: 65  Weight: 144 lb (65.3 kg)  Height: 5\' 4"  (1.626 m)   Vitals:  WNL General appearance: alert, cooperative and no distress  HEENT: Normocephalic, without obvious abnormality, atraumatic Eyes: negative Throat: lips, mucosa, and tongue normal; teeth and gums normal  Respiratory: Clear to auscultation bilaterally  CV: Regular rate and rhythm  Breasts:  Normal appearance, no masses or tenderness, no nipple retraction or dimpling  GI: Soft, non-tender; bowel sounds normal; no masses,  no organomegaly  GU: External Genitalia:  Tanner V, LS mostly controlled except a couple of small areas Urethra:  No prolapse   Vagina: Pink, normal rugae, no blood or discharge  Cervix: No CMT, no lesion  Uterus:  Normal size and contour, non tender  Adnexa: Normal, no masses, non tender  Musculoskeletal: No edema, redness or tenderness in the calves or thighs  Skin: No  lesions or rash  Lymphatic: Axillary adenopathy: none     Psychiatric: Normal mood and behavior      Assessment:    Healthy female exam.    Plan:    LS--increase steroid ointment application frequency to thickened areas.  If not resolving (monthly self vulvar exams) will need to come back in for biopsy.  Mammogram scheduled 09-27-22; got behind due to implantable loop recorder Pap up to date Colon screening--next due in 2025

## 2022-09-03 NOTE — Progress Notes (Signed)
Last Mammogram: 202- negative Last Pap Smear:  08/14/21- negative Last Colon Screening;  2015 Seat Belts:   yes Sun Screen:   yes Dental Check Up:  yes Brush & Floss:  yes

## 2023-02-14 ENCOUNTER — Other Ambulatory Visit: Payer: Self-pay | Admitting: *Deleted

## 2023-02-14 MED ORDER — ESTRADIOL 10 MCG VA TABS: ORAL_TABLET | VAGINAL | 12 refills | Status: AC

## 2023-08-08 ENCOUNTER — Other Ambulatory Visit: Payer: Self-pay

## 2023-08-08 DIAGNOSIS — N898 Other specified noninflammatory disorders of vagina: Secondary | ICD-10-CM

## 2023-08-08 MED ORDER — CLOBETASOL PROPIONATE 0.05 % EX OINT: TOPICAL_OINTMENT | CUTANEOUS | 1 refills | Status: AC

## 2023-12-03 ENCOUNTER — Telehealth: Payer: Self-pay | Admitting: *Deleted

## 2023-12-03 NOTE — Telephone Encounter (Signed)
Left patient a message to call and schedule annual. 

## 2024-01-20 ENCOUNTER — Ambulatory Visit: Payer: PRIVATE HEALTH INSURANCE | Admitting: Obstetrics & Gynecology

## 2024-02-13 ENCOUNTER — Other Ambulatory Visit: Payer: Self-pay | Admitting: Medical Genetics

## 2024-02-14 ENCOUNTER — Other Ambulatory Visit (HOSPITAL_COMMUNITY): Admission: RE | Admit: 2024-02-14 | Payer: Self-pay

## 2024-03-09 ENCOUNTER — Ambulatory Visit: Payer: PRIVATE HEALTH INSURANCE | Admitting: Obstetrics & Gynecology
